# Patient Record
Sex: Female | Born: 1935 | Race: White | Hispanic: No | Smoking: Never smoker
Health system: Southern US, Community
[De-identification: ages and names within clinical notes are randomized; demographics above are authoritative.]

## PROBLEM LIST (undated history)

## (undated) DIAGNOSIS — E119 Type 2 diabetes mellitus without complications: Secondary | ICD-10-CM

## (undated) DIAGNOSIS — E785 Hyperlipidemia, unspecified: Secondary | ICD-10-CM

## (undated) DIAGNOSIS — A159 Respiratory tuberculosis unspecified: Secondary | ICD-10-CM

## (undated) DIAGNOSIS — M1990 Unspecified osteoarthritis, unspecified site: Secondary | ICD-10-CM

## (undated) DIAGNOSIS — H269 Unspecified cataract: Secondary | ICD-10-CM

## (undated) DIAGNOSIS — I10 Essential (primary) hypertension: Secondary | ICD-10-CM

## (undated) DIAGNOSIS — N393 Stress incontinence (female) (male): Secondary | ICD-10-CM

## (undated) DIAGNOSIS — E559 Vitamin D deficiency, unspecified: Secondary | ICD-10-CM

## (undated) DIAGNOSIS — G709 Myoneural disorder, unspecified: Secondary | ICD-10-CM

## (undated) DIAGNOSIS — E538 Deficiency of other specified B group vitamins: Secondary | ICD-10-CM

## (undated) HISTORY — PX: OTHER SURGICAL HISTORY: SHX169

## (undated) HISTORY — DX: Deficiency of other specified B group vitamins: E53.8

## (undated) HISTORY — PX: ABDOMINAL HYSTERECTOMY: SHX81

## (undated) HISTORY — DX: Vitamin D deficiency, unspecified: E55.9

## (undated) HISTORY — PX: CATARACT EXTRACTION: SUR2

## (undated) HISTORY — PX: NASAL RECONSTRUCTION: SHX2069

## (undated) HISTORY — DX: Unspecified cataract: H26.9

## (undated) HISTORY — DX: Type 2 diabetes mellitus without complications: E11.9

## (undated) HISTORY — PX: BREAST REDUCTION SURGERY: SHX8

## (undated) HISTORY — DX: Unspecified osteoarthritis, unspecified site: M19.90

## (undated) HISTORY — PX: BREAST BIOPSY: SHX20

## (undated) HISTORY — DX: Essential (primary) hypertension: I10

## (undated) HISTORY — PX: REDUCTION MAMMAPLASTY: SUR839

## (undated) HISTORY — PX: COLONOSCOPY: SHX174

## (undated) HISTORY — DX: Hyperlipidemia, unspecified: E78.5

---

## 2006-06-23 ENCOUNTER — Ambulatory Visit: Payer: Self-pay | Admitting: Internal Medicine

## 2006-07-18 ENCOUNTER — Ambulatory Visit: Payer: Self-pay | Admitting: Internal Medicine

## 2007-06-25 ENCOUNTER — Encounter: Admission: RE | Admit: 2007-06-25 | Discharge: 2007-06-25 | Payer: Self-pay | Admitting: Internal Medicine

## 2008-07-01 ENCOUNTER — Encounter: Admission: RE | Admit: 2008-07-01 | Discharge: 2008-07-01 | Payer: Self-pay | Admitting: Internal Medicine

## 2009-07-04 ENCOUNTER — Encounter: Admission: RE | Admit: 2009-07-04 | Discharge: 2009-07-04 | Payer: Self-pay | Admitting: Internal Medicine

## 2010-07-23 ENCOUNTER — Other Ambulatory Visit: Payer: Self-pay | Admitting: Internal Medicine

## 2010-07-23 DIAGNOSIS — Z1231 Encounter for screening mammogram for malignant neoplasm of breast: Secondary | ICD-10-CM

## 2010-07-31 ENCOUNTER — Ambulatory Visit
Admission: RE | Admit: 2010-07-31 | Discharge: 2010-07-31 | Disposition: A | Discharge status: Home / Self Care | Payer: Medicare Other | Source: Ambulatory Visit | Attending: Internal Medicine | Admitting: Internal Medicine

## 2010-07-31 DIAGNOSIS — Z1231 Encounter for screening mammogram for malignant neoplasm of breast: Secondary | ICD-10-CM

## 2010-08-01 ENCOUNTER — Ambulatory Visit: Payer: Medicare Other | Attending: Orthopaedic Surgery | Admitting: Physical Therapy

## 2010-08-01 DIAGNOSIS — IMO0001 Reserved for inherently not codable concepts without codable children: Secondary | ICD-10-CM | POA: Insufficient documentation

## 2010-08-01 DIAGNOSIS — R5381 Other malaise: Secondary | ICD-10-CM | POA: Insufficient documentation

## 2010-08-01 DIAGNOSIS — M2550 Pain in unspecified joint: Secondary | ICD-10-CM | POA: Insufficient documentation

## 2010-08-01 DIAGNOSIS — M2560 Stiffness of unspecified joint, not elsewhere classified: Secondary | ICD-10-CM | POA: Insufficient documentation

## 2010-08-03 ENCOUNTER — Ambulatory Visit: Payer: Medicare Other | Admitting: Physical Therapy

## 2010-08-06 ENCOUNTER — Ambulatory Visit: Payer: Medicare Other | Admitting: Physical Therapy

## 2010-08-08 ENCOUNTER — Ambulatory Visit: Payer: Medicare Other | Attending: Orthopaedic Surgery | Admitting: Physical Therapy

## 2010-08-08 DIAGNOSIS — M2560 Stiffness of unspecified joint, not elsewhere classified: Secondary | ICD-10-CM | POA: Insufficient documentation

## 2010-08-08 DIAGNOSIS — R5381 Other malaise: Secondary | ICD-10-CM | POA: Insufficient documentation

## 2010-08-08 DIAGNOSIS — M2550 Pain in unspecified joint: Secondary | ICD-10-CM | POA: Insufficient documentation

## 2010-08-08 DIAGNOSIS — IMO0001 Reserved for inherently not codable concepts without codable children: Secondary | ICD-10-CM | POA: Insufficient documentation

## 2010-08-13 ENCOUNTER — Ambulatory Visit: Payer: Medicare Other | Admitting: Physical Therapy

## 2010-08-24 ENCOUNTER — Ambulatory Visit: Payer: Medicare Other | Admitting: Physical Therapy

## 2010-08-28 ENCOUNTER — Encounter: Payer: Medicare Other | Admitting: Physical Therapy

## 2010-09-04 ENCOUNTER — Ambulatory Visit: Payer: Medicare Other | Admitting: Physical Therapy

## 2011-01-16 DIAGNOSIS — IMO0001 Reserved for inherently not codable concepts without codable children: Secondary | ICD-10-CM | POA: Diagnosis not present

## 2011-01-16 DIAGNOSIS — M1990 Unspecified osteoarthritis, unspecified site: Secondary | ICD-10-CM | POA: Diagnosis not present

## 2011-01-16 DIAGNOSIS — R700 Elevated erythrocyte sedimentation rate: Secondary | ICD-10-CM | POA: Diagnosis not present

## 2011-01-16 DIAGNOSIS — M2550 Pain in unspecified joint: Secondary | ICD-10-CM | POA: Diagnosis not present

## 2011-02-26 DIAGNOSIS — E785 Hyperlipidemia, unspecified: Secondary | ICD-10-CM | POA: Diagnosis not present

## 2011-02-26 DIAGNOSIS — I10 Essential (primary) hypertension: Secondary | ICD-10-CM | POA: Diagnosis not present

## 2011-02-26 DIAGNOSIS — E119 Type 2 diabetes mellitus without complications: Secondary | ICD-10-CM | POA: Diagnosis not present

## 2011-02-26 DIAGNOSIS — M353 Polymyalgia rheumatica: Secondary | ICD-10-CM | POA: Diagnosis not present

## 2011-03-25 DIAGNOSIS — H2510 Age-related nuclear cataract, unspecified eye: Secondary | ICD-10-CM | POA: Diagnosis not present

## 2011-03-25 DIAGNOSIS — H52209 Unspecified astigmatism, unspecified eye: Secondary | ICD-10-CM | POA: Diagnosis not present

## 2011-03-25 DIAGNOSIS — E119 Type 2 diabetes mellitus without complications: Secondary | ICD-10-CM | POA: Diagnosis not present

## 2011-03-25 DIAGNOSIS — H25049 Posterior subcapsular polar age-related cataract, unspecified eye: Secondary | ICD-10-CM | POA: Diagnosis not present

## 2011-04-10 DIAGNOSIS — IMO0002 Reserved for concepts with insufficient information to code with codable children: Secondary | ICD-10-CM | POA: Diagnosis not present

## 2011-04-10 DIAGNOSIS — H25049 Posterior subcapsular polar age-related cataract, unspecified eye: Secondary | ICD-10-CM | POA: Diagnosis not present

## 2011-04-10 DIAGNOSIS — H2589 Other age-related cataract: Secondary | ICD-10-CM | POA: Diagnosis not present

## 2011-04-10 DIAGNOSIS — E119 Type 2 diabetes mellitus without complications: Secondary | ICD-10-CM | POA: Diagnosis not present

## 2011-04-10 DIAGNOSIS — H25019 Cortical age-related cataract, unspecified eye: Secondary | ICD-10-CM | POA: Diagnosis not present

## 2011-04-10 DIAGNOSIS — H2510 Age-related nuclear cataract, unspecified eye: Secondary | ICD-10-CM | POA: Diagnosis not present

## 2011-04-17 DIAGNOSIS — M1990 Unspecified osteoarthritis, unspecified site: Secondary | ICD-10-CM | POA: Diagnosis not present

## 2011-04-17 DIAGNOSIS — IMO0001 Reserved for inherently not codable concepts without codable children: Secondary | ICD-10-CM | POA: Diagnosis not present

## 2011-04-17 DIAGNOSIS — M2550 Pain in unspecified joint: Secondary | ICD-10-CM | POA: Diagnosis not present

## 2011-05-28 DIAGNOSIS — E785 Hyperlipidemia, unspecified: Secondary | ICD-10-CM | POA: Diagnosis not present

## 2011-05-28 DIAGNOSIS — M353 Polymyalgia rheumatica: Secondary | ICD-10-CM | POA: Diagnosis not present

## 2011-05-28 DIAGNOSIS — I10 Essential (primary) hypertension: Secondary | ICD-10-CM | POA: Diagnosis not present

## 2011-05-28 DIAGNOSIS — E119 Type 2 diabetes mellitus without complications: Secondary | ICD-10-CM | POA: Diagnosis not present

## 2011-06-25 ENCOUNTER — Other Ambulatory Visit: Payer: Self-pay | Admitting: Internal Medicine

## 2011-06-25 DIAGNOSIS — Z1231 Encounter for screening mammogram for malignant neoplasm of breast: Secondary | ICD-10-CM

## 2011-09-03 ENCOUNTER — Ambulatory Visit
Admission: RE | Admit: 2011-09-03 | Discharge: 2011-09-03 | Disposition: A | Discharge status: Home / Self Care | Payer: Medicare Other | Source: Ambulatory Visit | Attending: Internal Medicine | Admitting: Internal Medicine

## 2011-09-03 DIAGNOSIS — E559 Vitamin D deficiency, unspecified: Secondary | ICD-10-CM | POA: Diagnosis not present

## 2011-09-03 DIAGNOSIS — I10 Essential (primary) hypertension: Secondary | ICD-10-CM | POA: Diagnosis not present

## 2011-09-03 DIAGNOSIS — Z1231 Encounter for screening mammogram for malignant neoplasm of breast: Secondary | ICD-10-CM | POA: Diagnosis not present

## 2011-09-03 DIAGNOSIS — E119 Type 2 diabetes mellitus without complications: Secondary | ICD-10-CM | POA: Diagnosis not present

## 2011-09-03 DIAGNOSIS — D531 Other megaloblastic anemias, not elsewhere classified: Secondary | ICD-10-CM | POA: Diagnosis not present

## 2011-09-03 DIAGNOSIS — E785 Hyperlipidemia, unspecified: Secondary | ICD-10-CM | POA: Diagnosis not present

## 2011-10-16 DIAGNOSIS — R5383 Other fatigue: Secondary | ICD-10-CM | POA: Diagnosis not present

## 2011-10-16 DIAGNOSIS — M1990 Unspecified osteoarthritis, unspecified site: Secondary | ICD-10-CM | POA: Diagnosis not present

## 2011-10-16 DIAGNOSIS — M2550 Pain in unspecified joint: Secondary | ICD-10-CM | POA: Diagnosis not present

## 2011-10-16 DIAGNOSIS — IMO0001 Reserved for inherently not codable concepts without codable children: Secondary | ICD-10-CM | POA: Diagnosis not present

## 2011-10-22 DIAGNOSIS — Z23 Encounter for immunization: Secondary | ICD-10-CM | POA: Diagnosis not present

## 2011-11-12 DIAGNOSIS — E119 Type 2 diabetes mellitus without complications: Secondary | ICD-10-CM | POA: Diagnosis not present

## 2011-11-12 DIAGNOSIS — H40019 Open angle with borderline findings, low risk, unspecified eye: Secondary | ICD-10-CM | POA: Diagnosis not present

## 2011-11-12 DIAGNOSIS — H5200 Hypermetropia, unspecified eye: Secondary | ICD-10-CM | POA: Diagnosis not present

## 2011-11-12 DIAGNOSIS — H43819 Vitreous degeneration, unspecified eye: Secondary | ICD-10-CM | POA: Diagnosis not present

## 2011-11-26 DIAGNOSIS — E119 Type 2 diabetes mellitus without complications: Secondary | ICD-10-CM | POA: Diagnosis not present

## 2011-11-26 DIAGNOSIS — I10 Essential (primary) hypertension: Secondary | ICD-10-CM | POA: Diagnosis not present

## 2011-11-26 DIAGNOSIS — E538 Deficiency of other specified B group vitamins: Secondary | ICD-10-CM | POA: Diagnosis not present

## 2011-11-26 DIAGNOSIS — E785 Hyperlipidemia, unspecified: Secondary | ICD-10-CM | POA: Diagnosis not present

## 2011-11-26 DIAGNOSIS — E559 Vitamin D deficiency, unspecified: Secondary | ICD-10-CM | POA: Diagnosis not present

## 2011-12-03 DIAGNOSIS — Z0000 Encounter for general adult medical examination without abnormal findings: Secondary | ICD-10-CM | POA: Diagnosis not present

## 2011-12-03 DIAGNOSIS — E785 Hyperlipidemia, unspecified: Secondary | ICD-10-CM | POA: Diagnosis not present

## 2011-12-03 DIAGNOSIS — E1139 Type 2 diabetes mellitus with other diabetic ophthalmic complication: Secondary | ICD-10-CM | POA: Diagnosis not present

## 2011-12-03 DIAGNOSIS — I10 Essential (primary) hypertension: Secondary | ICD-10-CM | POA: Diagnosis not present

## 2011-12-09 DIAGNOSIS — Z1212 Encounter for screening for malignant neoplasm of rectum: Secondary | ICD-10-CM | POA: Diagnosis not present

## 2011-12-18 DIAGNOSIS — M19019 Primary osteoarthritis, unspecified shoulder: Secondary | ICD-10-CM | POA: Diagnosis not present

## 2011-12-18 DIAGNOSIS — M25519 Pain in unspecified shoulder: Secondary | ICD-10-CM | POA: Diagnosis not present

## 2012-03-02 DIAGNOSIS — M19019 Primary osteoarthritis, unspecified shoulder: Secondary | ICD-10-CM | POA: Diagnosis not present

## 2012-03-18 ENCOUNTER — Ambulatory Visit: Payer: Medicare Other | Attending: Orthopaedic Surgery | Admitting: Physical Therapy

## 2012-03-18 DIAGNOSIS — M25619 Stiffness of unspecified shoulder, not elsewhere classified: Secondary | ICD-10-CM | POA: Insufficient documentation

## 2012-03-18 DIAGNOSIS — M25519 Pain in unspecified shoulder: Secondary | ICD-10-CM | POA: Diagnosis not present

## 2012-03-18 DIAGNOSIS — IMO0001 Reserved for inherently not codable concepts without codable children: Secondary | ICD-10-CM | POA: Insufficient documentation

## 2012-03-19 ENCOUNTER — Ambulatory Visit: Payer: Medicare Other | Admitting: Physical Therapy

## 2012-03-19 DIAGNOSIS — M25519 Pain in unspecified shoulder: Secondary | ICD-10-CM | POA: Diagnosis not present

## 2012-03-19 DIAGNOSIS — IMO0001 Reserved for inherently not codable concepts without codable children: Secondary | ICD-10-CM | POA: Diagnosis not present

## 2012-03-19 DIAGNOSIS — M25619 Stiffness of unspecified shoulder, not elsewhere classified: Secondary | ICD-10-CM | POA: Diagnosis not present

## 2012-03-24 ENCOUNTER — Ambulatory Visit: Payer: Medicare Other | Admitting: Physical Therapy

## 2012-03-24 DIAGNOSIS — M25619 Stiffness of unspecified shoulder, not elsewhere classified: Secondary | ICD-10-CM | POA: Diagnosis not present

## 2012-03-24 DIAGNOSIS — M25519 Pain in unspecified shoulder: Secondary | ICD-10-CM | POA: Diagnosis not present

## 2012-03-24 DIAGNOSIS — IMO0001 Reserved for inherently not codable concepts without codable children: Secondary | ICD-10-CM | POA: Diagnosis not present

## 2012-03-25 ENCOUNTER — Ambulatory Visit: Payer: Medicare Other | Admitting: Physical Therapy

## 2012-03-25 DIAGNOSIS — IMO0001 Reserved for inherently not codable concepts without codable children: Secondary | ICD-10-CM | POA: Diagnosis not present

## 2012-03-25 DIAGNOSIS — M25519 Pain in unspecified shoulder: Secondary | ICD-10-CM | POA: Diagnosis not present

## 2012-03-25 DIAGNOSIS — M25619 Stiffness of unspecified shoulder, not elsewhere classified: Secondary | ICD-10-CM | POA: Diagnosis not present

## 2012-03-26 DIAGNOSIS — M2550 Pain in unspecified joint: Secondary | ICD-10-CM | POA: Diagnosis not present

## 2012-03-26 DIAGNOSIS — R5381 Other malaise: Secondary | ICD-10-CM | POA: Diagnosis not present

## 2012-03-26 DIAGNOSIS — IMO0001 Reserved for inherently not codable concepts without codable children: Secondary | ICD-10-CM | POA: Diagnosis not present

## 2012-03-26 DIAGNOSIS — R5383 Other fatigue: Secondary | ICD-10-CM | POA: Diagnosis not present

## 2012-03-26 DIAGNOSIS — M1990 Unspecified osteoarthritis, unspecified site: Secondary | ICD-10-CM | POA: Diagnosis not present

## 2012-03-31 ENCOUNTER — Ambulatory Visit: Payer: Medicare Other | Admitting: Physical Therapy

## 2012-03-31 DIAGNOSIS — IMO0001 Reserved for inherently not codable concepts without codable children: Secondary | ICD-10-CM | POA: Diagnosis not present

## 2012-03-31 DIAGNOSIS — M25619 Stiffness of unspecified shoulder, not elsewhere classified: Secondary | ICD-10-CM | POA: Diagnosis not present

## 2012-03-31 DIAGNOSIS — M25519 Pain in unspecified shoulder: Secondary | ICD-10-CM | POA: Diagnosis not present

## 2012-04-02 ENCOUNTER — Ambulatory Visit: Payer: Medicare Other | Admitting: Physical Therapy

## 2012-04-02 DIAGNOSIS — M25619 Stiffness of unspecified shoulder, not elsewhere classified: Secondary | ICD-10-CM | POA: Diagnosis not present

## 2012-04-02 DIAGNOSIS — M25519 Pain in unspecified shoulder: Secondary | ICD-10-CM | POA: Diagnosis not present

## 2012-04-02 DIAGNOSIS — IMO0001 Reserved for inherently not codable concepts without codable children: Secondary | ICD-10-CM | POA: Diagnosis not present

## 2012-04-07 ENCOUNTER — Encounter: Payer: Medicare Other | Admitting: Physical Therapy

## 2012-04-09 ENCOUNTER — Encounter: Payer: Medicare Other | Admitting: Physical Therapy

## 2012-04-14 ENCOUNTER — Ambulatory Visit: Payer: Medicare Other | Attending: Orthopaedic Surgery | Admitting: Physical Therapy

## 2012-04-14 DIAGNOSIS — M25619 Stiffness of unspecified shoulder, not elsewhere classified: Secondary | ICD-10-CM | POA: Insufficient documentation

## 2012-04-14 DIAGNOSIS — IMO0001 Reserved for inherently not codable concepts without codable children: Secondary | ICD-10-CM | POA: Insufficient documentation

## 2012-04-14 DIAGNOSIS — M25519 Pain in unspecified shoulder: Secondary | ICD-10-CM | POA: Insufficient documentation

## 2012-04-16 ENCOUNTER — Ambulatory Visit: Payer: Medicare Other | Admitting: Physical Therapy

## 2012-04-16 DIAGNOSIS — IMO0001 Reserved for inherently not codable concepts without codable children: Secondary | ICD-10-CM | POA: Diagnosis not present

## 2012-04-16 DIAGNOSIS — M25619 Stiffness of unspecified shoulder, not elsewhere classified: Secondary | ICD-10-CM | POA: Diagnosis not present

## 2012-04-16 DIAGNOSIS — M25519 Pain in unspecified shoulder: Secondary | ICD-10-CM | POA: Diagnosis not present

## 2012-04-20 ENCOUNTER — Ambulatory Visit: Payer: Medicare Other | Admitting: Physical Therapy

## 2012-04-20 DIAGNOSIS — M25619 Stiffness of unspecified shoulder, not elsewhere classified: Secondary | ICD-10-CM | POA: Diagnosis not present

## 2012-04-20 DIAGNOSIS — M25519 Pain in unspecified shoulder: Secondary | ICD-10-CM | POA: Diagnosis not present

## 2012-04-20 DIAGNOSIS — IMO0001 Reserved for inherently not codable concepts without codable children: Secondary | ICD-10-CM | POA: Diagnosis not present

## 2012-04-22 ENCOUNTER — Ambulatory Visit: Payer: Medicare Other

## 2012-04-22 DIAGNOSIS — IMO0001 Reserved for inherently not codable concepts without codable children: Secondary | ICD-10-CM | POA: Diagnosis not present

## 2012-04-22 DIAGNOSIS — M25519 Pain in unspecified shoulder: Secondary | ICD-10-CM | POA: Diagnosis not present

## 2012-04-22 DIAGNOSIS — M25619 Stiffness of unspecified shoulder, not elsewhere classified: Secondary | ICD-10-CM | POA: Diagnosis not present

## 2012-06-02 DIAGNOSIS — E785 Hyperlipidemia, unspecified: Secondary | ICD-10-CM | POA: Diagnosis not present

## 2012-06-02 DIAGNOSIS — E1139 Type 2 diabetes mellitus with other diabetic ophthalmic complication: Secondary | ICD-10-CM | POA: Diagnosis not present

## 2012-06-02 DIAGNOSIS — I10 Essential (primary) hypertension: Secondary | ICD-10-CM | POA: Diagnosis not present

## 2012-06-02 DIAGNOSIS — Z6829 Body mass index (BMI) 29.0-29.9, adult: Secondary | ICD-10-CM | POA: Diagnosis not present

## 2012-06-02 DIAGNOSIS — M25519 Pain in unspecified shoulder: Secondary | ICD-10-CM | POA: Diagnosis not present

## 2012-06-02 DIAGNOSIS — D518 Other vitamin B12 deficiency anemias: Secondary | ICD-10-CM | POA: Diagnosis not present

## 2012-06-02 DIAGNOSIS — E559 Vitamin D deficiency, unspecified: Secondary | ICD-10-CM | POA: Diagnosis not present

## 2012-06-02 DIAGNOSIS — M1990 Unspecified osteoarthritis, unspecified site: Secondary | ICD-10-CM | POA: Diagnosis not present

## 2012-08-19 ENCOUNTER — Other Ambulatory Visit: Payer: Self-pay

## 2012-08-19 DIAGNOSIS — Z1231 Encounter for screening mammogram for malignant neoplasm of breast: Secondary | ICD-10-CM

## 2012-09-02 ENCOUNTER — Ambulatory Visit
Admission: RE | Admit: 2012-09-02 | Discharge: 2012-09-02 | Disposition: A | Discharge status: Home / Self Care | Payer: Medicare Other | Source: Ambulatory Visit

## 2012-09-02 DIAGNOSIS — Z1231 Encounter for screening mammogram for malignant neoplasm of breast: Secondary | ICD-10-CM

## 2012-09-04 DIAGNOSIS — H40019 Open angle with borderline findings, low risk, unspecified eye: Secondary | ICD-10-CM | POA: Diagnosis not present

## 2012-09-04 DIAGNOSIS — H2510 Age-related nuclear cataract, unspecified eye: Secondary | ICD-10-CM | POA: Diagnosis not present

## 2012-09-04 DIAGNOSIS — H25019 Cortical age-related cataract, unspecified eye: Secondary | ICD-10-CM | POA: Diagnosis not present

## 2012-09-04 DIAGNOSIS — E119 Type 2 diabetes mellitus without complications: Secondary | ICD-10-CM | POA: Diagnosis not present

## 2012-12-02 DIAGNOSIS — E1139 Type 2 diabetes mellitus with other diabetic ophthalmic complication: Secondary | ICD-10-CM | POA: Diagnosis not present

## 2012-12-02 DIAGNOSIS — E559 Vitamin D deficiency, unspecified: Secondary | ICD-10-CM | POA: Diagnosis not present

## 2012-12-02 DIAGNOSIS — E538 Deficiency of other specified B group vitamins: Secondary | ICD-10-CM | POA: Diagnosis not present

## 2012-12-02 DIAGNOSIS — E785 Hyperlipidemia, unspecified: Secondary | ICD-10-CM | POA: Diagnosis not present

## 2012-12-08 DIAGNOSIS — M353 Polymyalgia rheumatica: Secondary | ICD-10-CM | POA: Diagnosis not present

## 2012-12-08 DIAGNOSIS — E559 Vitamin D deficiency, unspecified: Secondary | ICD-10-CM | POA: Diagnosis not present

## 2012-12-08 DIAGNOSIS — Z0000 Encounter for general adult medical examination without abnormal findings: Secondary | ICD-10-CM | POA: Diagnosis not present

## 2012-12-08 DIAGNOSIS — Z1331 Encounter for screening for depression: Secondary | ICD-10-CM | POA: Diagnosis not present

## 2012-12-08 DIAGNOSIS — Z23 Encounter for immunization: Secondary | ICD-10-CM | POA: Diagnosis not present

## 2012-12-08 DIAGNOSIS — D518 Other vitamin B12 deficiency anemias: Secondary | ICD-10-CM | POA: Diagnosis not present

## 2012-12-08 DIAGNOSIS — E785 Hyperlipidemia, unspecified: Secondary | ICD-10-CM | POA: Diagnosis not present

## 2012-12-08 DIAGNOSIS — I10 Essential (primary) hypertension: Secondary | ICD-10-CM | POA: Diagnosis not present

## 2012-12-08 DIAGNOSIS — M1990 Unspecified osteoarthritis, unspecified site: Secondary | ICD-10-CM | POA: Diagnosis not present

## 2012-12-08 DIAGNOSIS — E1139 Type 2 diabetes mellitus with other diabetic ophthalmic complication: Secondary | ICD-10-CM | POA: Diagnosis not present

## 2012-12-09 DIAGNOSIS — Z1212 Encounter for screening for malignant neoplasm of rectum: Secondary | ICD-10-CM | POA: Diagnosis not present

## 2012-12-14 ENCOUNTER — Encounter: Payer: Self-pay | Admitting: Internal Medicine

## 2013-01-15 ENCOUNTER — Encounter: Payer: Self-pay | Admitting: Internal Medicine

## 2013-01-15 ENCOUNTER — Ambulatory Visit (INDEPENDENT_AMBULATORY_CARE_PROVIDER_SITE_OTHER): Payer: Medicare Other | Admitting: Internal Medicine

## 2013-01-15 VITALS — BP 134/80 | HR 80 | Ht 65.0 in | Wt 171.0 lb

## 2013-01-15 DIAGNOSIS — R195 Other fecal abnormalities: Secondary | ICD-10-CM | POA: Diagnosis not present

## 2013-01-15 DIAGNOSIS — E1059 Type 1 diabetes mellitus with other circulatory complications: Secondary | ICD-10-CM | POA: Diagnosis not present

## 2013-01-15 DIAGNOSIS — R198 Other specified symptoms and signs involving the digestive system and abdomen: Secondary | ICD-10-CM | POA: Diagnosis not present

## 2013-01-15 MED ORDER — MOVIPREP 100 G PO SOLR: 1.0000 | Freq: Once | ORAL | Status: DC

## 2013-01-15 NOTE — Patient Instructions (Signed)

## 2013-01-15 NOTE — Progress Notes (Signed)
HISTORY OF PRESENT ILLNESS:  Laura Lowery is a 80 y.o. female with hyperlipidemia, arthritis, hypertension, and insulin requiring diabetes mellitus. She is sent today by her primary provider regarding Hemoccult-positive stool. The patient went for her annual evaluation (office evaluation note reviewed) and was complaining of fatigue. Blood work (reviewed) was unremarkable including CBC. Hemoccult studies returned positive. She had been using Mobic for arthritis. This has been discontinued. The patient's GI review of systems is negative except for more frequent and loose bowel movements. She denies heartburn, abdominal pain, dysphagia, melena, hematochezia, or weight loss. She last underwent colonoscopy 07/18/2006. The examination was complete to the cecum with excellent preparation. She was found to have severe pandiverticulosis. No polyps. No routine followup due to age. Her chronic medical problems are under good control. In addition to Lantus insulin she takes metformin for diabetes. She's a former Equities trader. She is quite active.  REVIEW OF SYSTEMS:  All non-GI ROS negative except for arthritis  Past Medical History  Diagnosis Date  . Hyperlipidemia   . DJD (degenerative joint disease)     c spine  . Osteoarthritis   . Cataracts, bilateral   . Hypertension   . Vitamin D deficiency   . Anemia   . Vitamin B 12 deficiency   . Diabetes     Past Surgical History  Procedure Laterality Date  . Bone spur    . Abdominal hysterectomy    . Nasal reconstruction    . Breast reduction surgery    . Breast biopsy    . Cataract extraction      Social History Laura Lowery  reports that she has never smoked. She has never used smokeless tobacco. She reports that she does not drink alcohol or use illicit drugs.  family history includes COPD in her father; Heart attack in her mother; Liver cancer in her sister; Pancreatic cancer in her sister. There is no history of Colon cancer.  Not on  File     PHYSICAL EXAMINATION: Vital signs: BP 134/80  Pulse 80  Ht 5\' 5"  (1.651 m)  Wt 171 lb (77.565 kg)  BMI 28.46 kg/m2  Constitutional: generally well-appearing, no acute distress Psychiatric: alert and oriented x3, cooperative Eyes: extraocular movements intact, anicteric, conjunctiva pink Mouth: oral pharynx moist, no lesions Neck: supple no lymphadenopathy Cardiovascular: heart regular rate and rhythm, no murmur Lungs: clear to auscultation bilaterally Abdomen: soft, nontender, nondistended, no obvious ascites, no peritoneal signs, normal bowel sounds, no organomegaly Rectal: Deferred until colonoscopy Extremities: no lower extremity edema bilaterally Skin: no lesions on visible extremities Neuro: No focal deficits.   ASSESSMENT:  #1. Hemoccult-positive stool. Etiology and clinical significance uncertain. No anemia. Negative GI review of systems except for increased frequency of bowel movements #2. Arthritis with chronic NSAID use. Recently discontinued due to Hemoccult-positive stool #3. General medical problems including diabetes. Stable   PLAN:  #1. We discussed in detail the pros and cons of evaluating Hemoccult-positive stool. We discussed a variety of alternative strategies. To this end, we will proceed with colonoscopy and upper endoscopy to exclude significant mucosal pathology. Patient is higher than baseline risk given her age, comorbidities.The nature of the procedure, as well as the risks, benefits, and alternatives were carefully and thoroughly reviewed with the patient. Ample time for discussion and questions allowed. The patient understood, was satisfied, and agreed to proceed. Movi prep prescribed. The patient instructed on his use #2. Instructed to give one half p.m. Lantus insulin the day before the procedure and hold  morning metformin. This to avoid and wanted hypoglycemia

## 2013-02-25 ENCOUNTER — Encounter: Payer: Self-pay | Admitting: Internal Medicine

## 2013-02-25 ENCOUNTER — Ambulatory Visit (AMBULATORY_SURGERY_CENTER): Payer: Medicare Other | Admitting: Internal Medicine

## 2013-02-25 VITALS — BP 160/77 | HR 57 | Temp 97.6°F | Resp 16 | Ht 65.0 in | Wt 171.0 lb

## 2013-02-25 DIAGNOSIS — R195 Other fecal abnormalities: Secondary | ICD-10-CM | POA: Diagnosis not present

## 2013-02-25 DIAGNOSIS — I10 Essential (primary) hypertension: Secondary | ICD-10-CM | POA: Diagnosis not present

## 2013-02-25 DIAGNOSIS — K222 Esophageal obstruction: Secondary | ICD-10-CM

## 2013-02-25 DIAGNOSIS — K625 Hemorrhage of anus and rectum: Secondary | ICD-10-CM | POA: Diagnosis not present

## 2013-02-25 DIAGNOSIS — E7800 Pure hypercholesterolemia, unspecified: Secondary | ICD-10-CM | POA: Diagnosis not present

## 2013-02-25 DIAGNOSIS — K2100 Gastro-esophageal reflux disease with esophagitis, without bleeding: Secondary | ICD-10-CM

## 2013-02-25 MED ORDER — SODIUM CHLORIDE 0.9 % IV SOLN: 500.0000 mL | INTRAVENOUS | Status: DC

## 2013-02-25 NOTE — Progress Notes (Signed)
Report to pacu rn, vss, bbs=clear 

## 2013-02-25 NOTE — Progress Notes (Addendum)
NO EGG OR SOY ALLERGY. EWM NO PROBLEMS WITH PAST SEDATION. EWM 1,2 IV ATTEMPTS TO RT ARM X1, RT ARM X2 UNSUCCESSFUL BY S HODGES RN. PT TOLERATED WELL/. EWM

## 2013-02-25 NOTE — Op Note (Signed)
[facility]  [address] [place], [address]   ENDOSCOPY PROCEDURE REPORT  PATIENT: Laura, Lowery  MR#: [id number] BIRTHDATE: Jul 24, 1928 , 84  yrs. old GENDER: Female ENDOSCOPIST: Eustace Quail, MD REFERRED BY:  Domenick Gong, M.D. PROCEDURE DATE:  02/25/2013 PROCEDURE:  EGD, diagnostic ASA CLASS:     Class II INDICATIONS:  Heme positive stool. MEDICATIONS: MAC sedation, administered by CRNA and propofol (Diprivan) 60mg  IV TOPICAL ANESTHETIC: none  DESCRIPTION OF PROCEDURE: After the risks benefits and alternatives of the procedure were thoroughly explained, informed consent was obtained.  The LB [id number] endoscope was introduced through the mouth and advanced to the second portion of the duodenum. Without limitations.  The instrument was slowly withdrawn as the mucosa was fully examined.      EXAM:The esophagus revealed distal esophagitis as manifested by friability and edema of the Z line.  As well, large-caliber peptic stricture.  The stomach was normal.  The duodenum was normal. Retroflexed views revealed a hiatal hernia.     The scope was then withdrawn from the patient and the procedure completed.  COMPLICATIONS: There were no complications. ENDOSCOPIC IMPRESSION: 1. GERD with endoscopic evidence of esophagitis and stricture (currently asymptomatic).  RECOMMENDATIONS: 1.  Anti-reflux regimen to be followed 2.  Recommend Prilosec OTC 20 mg daily  REPEAT EXAM:  eSigned:  Eustace Quail, MD 02/25/2013 12:45 PM   CW:[id number] Tisovec, MD and The Patient

## 2013-02-25 NOTE — Op Note (Signed)
[facility]  [address] [place], [id number]   COLONOSCOPY PROCEDURE REPORT  PATIENT: Laura Lowery, Laura Lowery  MR#: [id number] BIRTHDATE: 09-22-1928 , 84  yrs. old GENDER: Female ENDOSCOPIST: Eustace Quail, MD REFERRED MB:[id number] Tisovec, M.D. PROCEDURE DATE:  02/25/2013 PROCEDURE:   Colonoscopy, diagnostic First Screening Colonoscopy - Avg.  risk and is 50 yrs.  old or older - No.  Prior Negative Screening - Now for repeat screening. N/A  History of Adenoma - Now for follow-up colonoscopy & has been > or = to 3 yrs.  N/A  Polyps Removed Today? No.  Recommend repeat exam, <10 yrs? No. ASA CLASS:   Class II INDICATIONS:heme-positive stool. MEDICATIONS: MAC sedation, administered by CRNA and propofol (Diprivan) 200mg  IV  DESCRIPTION OF PROCEDURE:   After the risks benefits and alternatives of the procedure were thoroughly explained, informed consent was obtained.  A digital rectal exam revealed no abnormalities of the rectum.   The LB [id number]  endoscope was introduced through the anus and advanced to the cecum, which was identified by both the appendix and ileocecal valve. No adverse events experienced.   The quality of the prep was excellent, using MoviPrep  The instrument was then slowly withdrawn as the colon was fully examined.   COLON FINDINGS: Moderate diverticulosis was noted The finding was in the right colon and The finding was left colon.   The colon mucosa was otherwise normal.  Retroflexed views revealed internal hemorrhoids. The time to cecum=2 minutes 06 seconds.  Withdrawal time=8 minutes 01 seconds.  The scope was withdrawn and the procedure completed. COMPLICATIONS: There were no complications.  ENDOSCOPIC IMPRESSION: 1.   Moderate diverticulosis was noted in the right colon and left colon 2.   The colon mucosa was otherwise normal  RECOMMENDATIONS: 1. Upper endoscopy today (see report)   eSigned:  Eustace Quail, MD 02/25/2013  12:40 PM   cc: Domenick Gong, MD and The Patient

## 2013-02-25 NOTE — Patient Instructions (Signed)
YOU HAD AN ENDOSCOPIC PROCEDURE TODAY AT THE [facility]: Refer to the procedure report that was given to you for any specific questions about what was found during the examination.  If the procedure report does not answer your questions, please call your gastroenterologist to clarify.  If you requested that your care partner not be given the details of your procedure findings, then the procedure report has been included in a sealed envelope for you to review at your convenience later.  YOU SHOULD EXPECT: Some feelings of bloating in the abdomen. Passage of more gas than usual.  Walking can help get rid of the air that was put into your GI tract during the procedure and reduce the bloating. If you had a lower endoscopy (such as a colonoscopy or flexible sigmoidoscopy) you may notice spotting of blood in your stool or on the toilet paper. If you underwent a bowel prep for your procedure, then you may not have a normal bowel movement for a few days.  DIET: Your first meal following the procedure should be a light meal and then it is ok to progress to your normal diet.  A half-sandwich or bowl of soup is an example of a good first meal.  Heavy or fried foods are harder to digest and may make you feel nauseous or bloated.  Likewise meals heavy in dairy and vegetables can cause extra gas to form and this can also increase the bloating.  Drink plenty of fluids but you should avoid alcoholic beverages for 24 hours.  ACTIVITY: Your care partner should take you home directly after the procedure.  You should plan to take it easy, moving slowly for the rest of the day.  You can resume normal activity the day after the procedure however you should NOT DRIVE or use heavy machinery for 24 hours (because of the sedation medicines used during the test).    SYMPTOMS TO REPORT IMMEDIATELY: A gastroenterologist can be reached at any hour.  During normal business hours, 8:30 AM to 5:00 PM Monday through Friday,  call [phone].  After hours and on weekends, please call the GI answering service at [phone] who will take a message and have the physician on call contact you.   Following lower endoscopy (colonoscopy or flexible sigmoidoscopy):  Excessive amounts of blood in the stool  Significant tenderness or worsening of abdominal pains  Swelling of the abdomen that is new, acute  Fever of 100F or higher  Following upper endoscopy (EGD)  Vomiting of blood or coffee ground material  New chest pain or pain under the shoulder blades  Painful or persistently difficult swallowing  New shortness of breath  Fever of 100F or higher  Black, tarry-looking stools  FOLLOW UP: If any biopsies were taken you will be contacted by phone or by letter within the next 1-3 weeks.  Call your gastroenterologist if you have not heard about the biopsies in 3 weeks.  Our staff will call the home number listed on your records the next business day following your procedure to check on you and address any questions or concerns that you may have at that time regarding the information given to you following your procedure. This is a courtesy call and so if there is no answer at the home number and we have not heard from you through the emergency physician on call, we will assume that you have returned to your regular daily activities without incident.  SIGNATURES/CONFIDENTIALITY: You and/or your care   partner have signed paperwork which will be entered into your electronic medical record.  These signatures attest to the fact that that the information above on your After Visit Summary has been reviewed and is understood.  Full responsibility of the confidentiality of this discharge information lies with you and/or your care-partner.  GERD, esophagitis, stricture,anti-reflux, Diverticulosis, high fiber diet-handouts given  Prilosec Over the counter 20 mg daily.

## 2013-02-26 ENCOUNTER — Telehealth: Payer: Self-pay | Admitting: *Deleted

## 2013-02-26 NOTE — Telephone Encounter (Signed)
  Follow up Call-  Call back number 02/25/2013  Post procedure Call Back phone  # [phone]  Permission to leave phone message Yes     Patient questions:  Do you have a fever, pain , or abdominal swelling? no Pain Score  0 *  Have you tolerated food without any problems? yes  Have you been able to return to your normal activities? yes  Do you have any questions about your discharge instructions: Diet   no Medications  no Follow up visit  no  Do you have questions or concerns about your Care? no  Actions: * If pain score is 4 or above: No action needed, pain <4.

## 2013-05-21 DIAGNOSIS — H25049 Posterior subcapsular polar age-related cataract, unspecified eye: Secondary | ICD-10-CM | POA: Diagnosis not present

## 2013-05-21 DIAGNOSIS — E119 Type 2 diabetes mellitus without complications: Secondary | ICD-10-CM | POA: Diagnosis not present

## 2013-05-21 DIAGNOSIS — H25019 Cortical age-related cataract, unspecified eye: Secondary | ICD-10-CM | POA: Diagnosis not present

## 2013-05-21 DIAGNOSIS — H2510 Age-related nuclear cataract, unspecified eye: Secondary | ICD-10-CM | POA: Diagnosis not present

## 2013-06-10 DIAGNOSIS — H259 Unspecified age-related cataract: Secondary | ICD-10-CM | POA: Diagnosis not present

## 2013-06-10 DIAGNOSIS — E785 Hyperlipidemia, unspecified: Secondary | ICD-10-CM | POA: Diagnosis not present

## 2013-06-10 DIAGNOSIS — E1139 Type 2 diabetes mellitus with other diabetic ophthalmic complication: Secondary | ICD-10-CM | POA: Diagnosis not present

## 2013-06-10 DIAGNOSIS — M353 Polymyalgia rheumatica: Secondary | ICD-10-CM | POA: Diagnosis not present

## 2013-06-10 DIAGNOSIS — M1990 Unspecified osteoarthritis, unspecified site: Secondary | ICD-10-CM | POA: Diagnosis not present

## 2013-06-10 DIAGNOSIS — I10 Essential (primary) hypertension: Secondary | ICD-10-CM | POA: Diagnosis not present

## 2013-06-10 DIAGNOSIS — E559 Vitamin D deficiency, unspecified: Secondary | ICD-10-CM | POA: Diagnosis not present

## 2013-06-10 DIAGNOSIS — Z6829 Body mass index (BMI) 29.0-29.9, adult: Secondary | ICD-10-CM | POA: Diagnosis not present

## 2013-06-16 DIAGNOSIS — E119 Type 2 diabetes mellitus without complications: Secondary | ICD-10-CM | POA: Diagnosis not present

## 2013-06-16 DIAGNOSIS — H2589 Other age-related cataract: Secondary | ICD-10-CM | POA: Diagnosis not present

## 2013-06-16 DIAGNOSIS — H25049 Posterior subcapsular polar age-related cataract, unspecified eye: Secondary | ICD-10-CM | POA: Diagnosis not present

## 2013-06-16 DIAGNOSIS — H25019 Cortical age-related cataract, unspecified eye: Secondary | ICD-10-CM | POA: Diagnosis not present

## 2013-06-16 DIAGNOSIS — H2510 Age-related nuclear cataract, unspecified eye: Secondary | ICD-10-CM | POA: Diagnosis not present

## 2013-07-29 ENCOUNTER — Other Ambulatory Visit: Payer: Self-pay

## 2013-07-29 DIAGNOSIS — Z1231 Encounter for screening mammogram for malignant neoplasm of breast: Secondary | ICD-10-CM

## 2013-09-03 ENCOUNTER — Encounter (INDEPENDENT_AMBULATORY_CARE_PROVIDER_SITE_OTHER): Payer: Self-pay

## 2013-09-03 ENCOUNTER — Ambulatory Visit
Admission: RE | Admit: 2013-09-03 | Discharge: 2013-09-03 | Disposition: A | Discharge status: Home / Self Care | Payer: Medicare Other | Source: Ambulatory Visit

## 2013-09-03 DIAGNOSIS — Z1231 Encounter for screening mammogram for malignant neoplasm of breast: Secondary | ICD-10-CM | POA: Diagnosis not present

## 2013-11-12 DIAGNOSIS — M1711 Unilateral primary osteoarthritis, right knee: Secondary | ICD-10-CM | POA: Diagnosis not present

## 2013-12-07 DIAGNOSIS — E785 Hyperlipidemia, unspecified: Secondary | ICD-10-CM | POA: Diagnosis not present

## 2013-12-07 DIAGNOSIS — I10 Essential (primary) hypertension: Secondary | ICD-10-CM | POA: Diagnosis not present

## 2013-12-07 DIAGNOSIS — E538 Deficiency of other specified B group vitamins: Secondary | ICD-10-CM | POA: Diagnosis not present

## 2013-12-07 DIAGNOSIS — Z780 Asymptomatic menopausal state: Secondary | ICD-10-CM | POA: Diagnosis not present

## 2013-12-07 DIAGNOSIS — N390 Urinary tract infection, site not specified: Secondary | ICD-10-CM | POA: Diagnosis not present

## 2013-12-07 DIAGNOSIS — Z0000 Encounter for general adult medical examination without abnormal findings: Secondary | ICD-10-CM | POA: Diagnosis not present

## 2013-12-07 DIAGNOSIS — E559 Vitamin D deficiency, unspecified: Secondary | ICD-10-CM | POA: Diagnosis not present

## 2013-12-07 DIAGNOSIS — E1139 Type 2 diabetes mellitus with other diabetic ophthalmic complication: Secondary | ICD-10-CM | POA: Diagnosis not present

## 2013-12-09 DIAGNOSIS — H259 Unspecified age-related cataract: Secondary | ICD-10-CM | POA: Diagnosis not present

## 2013-12-09 DIAGNOSIS — I10 Essential (primary) hypertension: Secondary | ICD-10-CM | POA: Diagnosis not present

## 2013-12-09 DIAGNOSIS — E785 Hyperlipidemia, unspecified: Secondary | ICD-10-CM | POA: Diagnosis not present

## 2013-12-09 DIAGNOSIS — E559 Vitamin D deficiency, unspecified: Secondary | ICD-10-CM | POA: Diagnosis not present

## 2013-12-09 DIAGNOSIS — M1711 Unilateral primary osteoarthritis, right knee: Secondary | ICD-10-CM | POA: Diagnosis not present

## 2013-12-09 DIAGNOSIS — Z0000 Encounter for general adult medical examination without abnormal findings: Secondary | ICD-10-CM | POA: Diagnosis not present

## 2013-12-09 DIAGNOSIS — M1990 Unspecified osteoarthritis, unspecified site: Secondary | ICD-10-CM | POA: Diagnosis not present

## 2013-12-09 DIAGNOSIS — E11319 Type 2 diabetes mellitus with unspecified diabetic retinopathy without macular edema: Secondary | ICD-10-CM | POA: Diagnosis not present

## 2013-12-09 DIAGNOSIS — Z23 Encounter for immunization: Secondary | ICD-10-CM | POA: Diagnosis not present

## 2013-12-09 DIAGNOSIS — M353 Polymyalgia rheumatica: Secondary | ICD-10-CM | POA: Diagnosis not present

## 2013-12-10 DIAGNOSIS — Z1212 Encounter for screening for malignant neoplasm of rectum: Secondary | ICD-10-CM | POA: Diagnosis not present

## 2014-02-24 ENCOUNTER — Ambulatory Visit: Payer: Self-pay | Admitting: Orthopedic Surgery

## 2014-02-24 DIAGNOSIS — M1711 Unilateral primary osteoarthritis, right knee: Secondary | ICD-10-CM | POA: Diagnosis not present

## 2014-02-24 NOTE — Progress Notes (Signed)
Preoperative surgical orders have been place into the Epic hospital system for Laura Lowery on 02/24/2014, 12:56 PM  by Mickel Crow for surgery on 03-28-2014.  Preop Total Knee orders including Experal, IV Tylenol, and IV Decadron as long as there are no contraindications to the above medications. Arlee Muslim, PA-C

## 2014-03-10 DIAGNOSIS — E785 Hyperlipidemia, unspecified: Secondary | ICD-10-CM | POA: Diagnosis not present

## 2014-03-10 DIAGNOSIS — M353 Polymyalgia rheumatica: Secondary | ICD-10-CM | POA: Diagnosis not present

## 2014-03-10 DIAGNOSIS — Z6829 Body mass index (BMI) 29.0-29.9, adult: Secondary | ICD-10-CM | POA: Diagnosis not present

## 2014-03-10 DIAGNOSIS — E559 Vitamin D deficiency, unspecified: Secondary | ICD-10-CM | POA: Diagnosis not present

## 2014-03-10 DIAGNOSIS — M859 Disorder of bone density and structure, unspecified: Secondary | ICD-10-CM | POA: Diagnosis not present

## 2014-03-10 DIAGNOSIS — E119 Type 2 diabetes mellitus without complications: Secondary | ICD-10-CM | POA: Diagnosis not present

## 2014-03-10 DIAGNOSIS — D519 Vitamin B12 deficiency anemia, unspecified: Secondary | ICD-10-CM | POA: Diagnosis not present

## 2014-03-10 DIAGNOSIS — M179 Osteoarthritis of knee, unspecified: Secondary | ICD-10-CM | POA: Diagnosis not present

## 2014-03-10 DIAGNOSIS — I10 Essential (primary) hypertension: Secondary | ICD-10-CM | POA: Diagnosis not present

## 2014-03-10 DIAGNOSIS — E1136 Type 2 diabetes mellitus with diabetic cataract: Secondary | ICD-10-CM | POA: Diagnosis not present

## 2014-03-21 NOTE — Patient Instructions (Addendum)
Laura Lowery  03/21/2014   Your procedure is scheduled on:  03/28/2014    Report to [facility] Main  Entrance and follow signs to               [place] at       0730 AM.  Call this number if you have problems the morning of surgery [phone]   Remember: Eat a good healthy snack prior to bedtime.   Do not eat food or drink liquids :After Midnight.     Take these medicines the morning of surgery with A SIP OF WATER: none                Take 1/2 of evening dose of Insulin nite before surgery                                You may not have any metal on your body including hair pins and              piercings  Do not wear jewelry, make-up, lotions, powders or perfumes., deodorant.               Do not wear nail polish.  Do not shave  48 hours prior to surgery.     Do not bring valuables to the hospital. [place].  Contacts, dentures or bridgework may not be worn into surgery.  Leave suitcase in the car. After surgery it may be brought to your room.         Special Instructions: coughing and deep breathing exercises, leg exercises               Please read over the following fact sheets you were given: _____________________________________________________________________             [facility] - Preparing for Surgery Before surgery, you can play an important role.  Because skin is not sterile, your skin needs to be as free of germs as possible.  You can reduce the number of germs on your skin by washing with CHG (chlorahexidine gluconate) soap before surgery.  CHG is an antiseptic cleaner which kills germs and bonds with the skin to continue killing germs even after washing. Please DO NOT use if you have an allergy to CHG or antibacterial soaps.  If your skin becomes reddened/irritated stop using the CHG and inform your nurse when you arrive at Short Stay. Do not shave (including legs and  underarms) for at least 48 hours prior to the first CHG shower.  You may shave your face/neck. Please follow these instructions carefully:  1.  Shower with CHG Soap the night before surgery and the  morning of Surgery.  2.  If you choose to wash your hair, wash your hair first as usual with your  normal  shampoo.  3.  After you shampoo, rinse your hair and body thoroughly to remove the  shampoo.                           4.  Use CHG as you would any other liquid soap.  You can apply chg directly  to the skin and wash  Gently with a scrungie or clean washcloth.  5.  Apply the CHG Soap to your body ONLY FROM THE NECK DOWN.   Do not use on face/ open                           Wound or open sores. Avoid contact with eyes, ears mouth and genitals (private parts).                       Wash face,  Genitals (private parts) with your normal soap.             6.  Wash thoroughly, paying special attention to the area where your surgery  will be performed.  7.  Thoroughly rinse your body with warm water from the neck down.  8.  DO NOT shower/wash with your normal soap after using and rinsing off  the CHG Soap.                9.  Pat yourself dry with a clean towel.            10.  Wear clean pajamas.            11.  Place clean sheets on your bed the night of your first shower and do not  sleep with pets. Day of Surgery : Do not apply any lotions/deodorants the morning of surgery.  Please wear clean clothes to the hospital/surgery center.  FAILURE TO FOLLOW THESE INSTRUCTIONS MAY RESULT IN THE CANCELLATION OF YOUR SURGERY PATIENT SIGNATURE_________________________________  NURSE SIGNATURE__________________________________  ________________________________________________________________________  WHAT IS A BLOOD TRANSFUSION? Blood Transfusion Information  A transfusion is the replacement of blood or some of its parts. Blood is made up of multiple cells which provide different  functions.  Red blood cells carry oxygen and are used for blood loss replacement.  White blood cells fight against infection.  Platelets control bleeding.  Plasma helps clot blood.  Other blood products are available for specialized needs, such as hemophilia or other clotting disorders. BEFORE THE TRANSFUSION  Who gives blood for transfusions?   Healthy volunteers who are fully evaluated to make sure their blood is safe. This is blood bank blood. Transfusion therapy is the safest it has ever been in the practice of medicine. Before blood is taken from a donor, a complete history is taken to make sure that person has no history of diseases nor engages in risky social behavior (examples are intravenous drug use or sexual activity with multiple partners). The donor's travel history is screened to minimize risk of transmitting infections, such as malaria. The donated blood is tested for signs of infectious diseases, such as HIV and hepatitis. The blood is then tested to be sure it is compatible with you in order to minimize the chance of a transfusion reaction. If you or a relative donates blood, this is often done in anticipation of surgery and is not appropriate for emergency situations. It takes many days to process the donated blood. RISKS AND COMPLICATIONS Although transfusion therapy is very safe and saves many lives, the main dangers of transfusion include:  1. Getting an infectious disease. 2. Developing a transfusion reaction. This is an allergic reaction to something in the blood you were given. Every precaution is taken to prevent this. The decision to have a blood transfusion has been considered carefully by your caregiver before blood is given. Blood is not given unless the benefits outweigh  the risks. AFTER THE TRANSFUSION  Right after receiving a blood transfusion, you will usually feel much better and more energetic. This is especially true if your red blood cells have gotten low  (anemic). The transfusion raises the level of the red blood cells which carry oxygen, and this usually causes an energy increase.  The nurse administering the transfusion will monitor you carefully for complications. HOME CARE INSTRUCTIONS  No special instructions are needed after a transfusion. You may find your energy is better. Speak with your caregiver about any limitations on activity for underlying diseases you may have. SEEK MEDICAL CARE IF:   Your condition is not improving after your transfusion.  You develop redness or irritation at the intravenous (IV) site. SEEK IMMEDIATE MEDICAL CARE IF:  Any of the following symptoms occur over the next 12 hours:  Shaking chills.  You have a temperature by mouth above 102 F (38.9 C), not controlled by medicine.  Chest, back, or muscle pain.  People around you feel you are not acting correctly or are confused.  Shortness of breath or difficulty breathing.  Dizziness and fainting.  You get a rash or develop hives.  You have a decrease in urine output.  Your urine turns a dark color or changes to pink, red, or brown. Any of the following symptoms occur over the next 10 days:  You have a temperature by mouth above 102 F (38.9 C), not controlled by medicine.  Shortness of breath.  Weakness after normal activity.  The white part of the eye turns yellow (jaundice).  You have a decrease in the amount of urine or are urinating less often.  Your urine turns a dark color or changes to pink, red, or brown. Document Released: 12/22/1999 Document Revised: 03/18/2011 Document Reviewed: 08/10/2007 Exit[employer] Patient Information 2014 [employer].  _______________________________________________________________________  Incentive Spirometer  An incentive spirometer is a tool that can help keep your lungs clear and active. This tool measures how well you are filling your lungs with each breath. Taking long deep breaths may help  reverse or decrease the chance of developing breathing (pulmonary) problems (especially infection) following:  A long period of time when you are unable to move or be active. BEFORE THE PROCEDURE   If the spirometer includes an indicator to show your best effort, your nurse or respiratory therapist will set it to a desired goal.  If possible, sit up straight or lean slightly forward. Try not to slouch.  Hold the incentive spirometer in an upright position. INSTRUCTIONS FOR USE  3. Sit on the edge of your bed if possible, or sit up as far as you can in bed or on a chair. 4. Hold the incentive spirometer in an upright position. 5. Breathe out normally. 6. Place the mouthpiece in your mouth and seal your lips tightly around it. 7. Breathe in slowly and as deeply as possible, raising the piston or the ball toward the top of the column. 8. Hold your breath for 3-5 seconds or for as long as possible. Allow the piston or ball to fall to the bottom of the column. 9. Remove the mouthpiece from your mouth and breathe out normally. 10. Rest for a few seconds and repeat Steps 1 through 7 at least 10 times every 1-2 hours when you are awake. Take your time and take a few normal breaths between deep breaths. 11. The spirometer may include an indicator to show your best effort. Use the indicator as a goal to work  toward during each repetition. 12. After each set of 10 deep breaths, practice coughing to be sure your lungs are clear. If you have an incision (the cut made at the time of surgery), support your incision when coughing by placing a pillow or rolled up towels firmly against it. Once you are able to get out of bed, walk around indoors and cough well. You may stop using the incentive spirometer when instructed by your caregiver.  RISKS AND COMPLICATIONS  Take your time so you do not get dizzy or light-headed.  If you are in pain, you may need to take or ask for pain medication before doing  incentive spirometry. It is harder to take a deep breath if you are having pain. AFTER USE  Rest and breathe slowly and easily.  It can be helpful to keep track of a log of your progress. Your caregiver can provide you with a simple table to help with this. If you are using the spirometer at home, follow these instructions: [place] IF:   You are having difficultly using the spirometer.  You have trouble using the spirometer as often as instructed.  Your pain medication is not giving enough relief while using the spirometer.  You develop fever of 100.5 F (38.1 C) or higher. SEEK IMMEDIATE MEDICAL CARE IF:   You cough up bloody sputum that had not been present before.  You develop fever of 102 F (38.9 C) or greater.  You develop worsening pain at or near the incision site. MAKE SURE YOU:   Understand these instructions.  Will watch your condition.  Will get help right away if you are not doing well or get worse. Document Released: 05/06/2006 Document Revised: 03/18/2011 Document Reviewed: 07/07/2006 [facility] Patient Information 2014 [place], [place].   ________________________________________________________________________

## 2014-03-22 ENCOUNTER — Other Ambulatory Visit: Payer: Self-pay

## 2014-03-22 ENCOUNTER — Encounter (HOSPITAL_COMMUNITY)
Admission: RE | Admit: 2014-03-22 | Discharge: 2014-03-22 | Disposition: A | Discharge status: Home / Self Care | Payer: Medicare Other | Source: Ambulatory Visit | Attending: Orthopedic Surgery | Admitting: Orthopedic Surgery

## 2014-03-22 ENCOUNTER — Encounter (HOSPITAL_COMMUNITY): Payer: Self-pay

## 2014-03-22 DIAGNOSIS — Z01812 Encounter for preprocedural laboratory examination: Secondary | ICD-10-CM | POA: Insufficient documentation

## 2014-03-22 DIAGNOSIS — Z01810 Encounter for preprocedural cardiovascular examination: Secondary | ICD-10-CM | POA: Diagnosis not present

## 2014-03-22 DIAGNOSIS — M1711 Unilateral primary osteoarthritis, right knee: Secondary | ICD-10-CM | POA: Diagnosis not present

## 2014-03-22 HISTORY — DX: Myoneural disorder, unspecified: G70.9

## 2014-03-22 HISTORY — DX: Stress incontinence (female) (male): N39.3

## 2014-03-22 HISTORY — DX: Respiratory tuberculosis unspecified: A15.9

## 2014-03-22 LAB — COMPREHENSIVE METABOLIC PANEL
ALBUMIN: 4.2 g/dL (ref 3.5–5.2)
ALK PHOS: 53 U/L (ref 39–117)
ALT: 16 U/L (ref 0–35)
AST: 20 U/L (ref 0–37)
Anion gap: 5 (ref 5–15)
BUN: 15 mg/dL (ref 6–23)
CO2: 28 mmol/L (ref 19–32)
Calcium: 9.5 mg/dL (ref 8.4–10.5)
Chloride: 106 mmol/L (ref 96–112)
Creatinine, Ser: 0.82 mg/dL (ref 0.50–1.10)
GFR calc non Af Amer: 64 mL/min — ABNORMAL LOW (ref >90)
GFR, EST AFRICAN AMERICAN: 75 mL/min — AB (ref >90)
GLUCOSE: 144 mg/dL — AB (ref 70–99)
POTASSIUM: 4 mmol/L (ref 3.5–5.1)
SODIUM: 139 mmol/L (ref 135–145)
TOTAL PROTEIN: 7.1 g/dL (ref 6.0–8.3)
Total Bilirubin: 0.3 mg/dL (ref 0.3–1.2)

## 2014-03-22 LAB — CBC
HCT: 39 % (ref 36.0–46.0)
HEMOGLOBIN: 12.9 g/dL (ref 12.0–15.0)
MCH: 29.9 pg (ref 26.0–34.0)
MCHC: 33.1 g/dL (ref 30.0–36.0)
MCV: 90.5 fL (ref 78.0–100.0)
PLATELETS: 321 10*3/uL (ref 150–400)
RBC: 4.31 MIL/uL (ref 3.87–5.11)
RDW: 12.5 % (ref 11.5–15.5)
WBC: 6.8 10*3/uL (ref 4.0–10.5)

## 2014-03-22 LAB — PROTIME-INR
INR: 1.04 (ref 0.00–1.49)
Prothrombin Time: 13.7 seconds (ref 11.6–15.2)

## 2014-03-22 LAB — URINALYSIS, ROUTINE W REFLEX MICROSCOPIC
Bilirubin Urine: NEGATIVE
GLUCOSE, UA: NEGATIVE mg/dL
Hgb urine dipstick: NEGATIVE
KETONES UR: NEGATIVE mg/dL
Leukocytes, UA: NEGATIVE
NITRITE: NEGATIVE
Protein, ur: NEGATIVE mg/dL
Specific Gravity, Urine: 1.01 (ref 1.005–1.030)
Urobilinogen, UA: 0.2 mg/dL (ref 0.0–1.0)
pH: 5.5 (ref 5.0–8.0)

## 2014-03-22 LAB — SURGICAL PCR SCREEN
MRSA, PCR: NEGATIVE
Staphylococcus aureus: POSITIVE — AB

## 2014-03-22 LAB — APTT: APTT: 30 s (ref 24–37)

## 2014-03-23 NOTE — H&P (Signed)
TOTAL KNEE ADMISSION H&P  Patient is being admitted for right total knee arthroplasty.  Subjective:  Chief Complaint:right knee pain.  HPI: Laura Lowery, 81 y.o. female, has a history of pain and functional disability in the right knee due to arthritis and has failed non-surgical conservative treatments for greater than 12 weeks to includeNSAID's and/or analgesics, corticosteriod injections and activity modification.  Onset of symptoms was gradual, starting 6 years ago with gradually worsening course since that time. The patient noted no past surgery on the right knee(s).  Patient currently rates pain in the right knee(s) at 8 out of 10 with activity. Patient has night pain, worsening of pain with activity and weight bearing, pain that interferes with activities of daily living, pain with passive range of motion, crepitus and joint swelling.  Patient has evidence of periarticular osteophytes and joint space narrowing by imaging studies.  There is no active infection.   Past Medical History  Diagnosis Date  . Hyperlipidemia   . DJD (degenerative joint disease)     c spine  . Osteoarthritis   . Cataracts, bilateral   . Hypertension   . Vitamin D deficiency   . Vitamin B 12 deficiency   . Diabetes   . Neuromuscular disorder     neuropathy in feet   . Tuberculosis     hx of positive TB since age 29   . Stress incontinence     Past Surgical History  Procedure Laterality Date  . Bone spur    . Abdominal hysterectomy    . Nasal reconstruction    . Breast reduction surgery    . Breast biopsy    . Cataract extraction      bilateral   . Colonoscopy        Current outpatient prescriptions:  .  benazepril (LOTENSIN) 10 MG tablet, Take 10 mg by mouth every morning. , Disp: , Rfl:  .  cholecalciferol (VITAMIN D) 1000 UNITS tablet, Take 1,000 Units by mouth daily., Disp: , Rfl:  .  cyanocobalamin (,VITAMIN B-12,) 1000 MCG/ML injection, Inject 1,000 mcg into the muscle every 30 (thirty)  days., Disp: , Rfl:  .  furosemide (LASIX) 20 MG tablet, Take 20 mg by mouth daily as needed for fluid. , Disp: , Rfl:  .  insulin glargine (LANTUS) 100 UNIT/ML injection, Inject 34 Units into the skin at bedtime., Disp: , Rfl:  .  meloxicam (MOBIC) 15 MG tablet, Take 7.5 mg by mouth daily as needed for pain., Disp: , Rfl:  .  metFORMIN (GLUCOPHAGE) 1000 MG tablet, Take 1,000 mg by mouth 2 (two) times daily with a meal., Disp: , Rfl:  .  pravastatin (PRAVACHOL) 40 MG tablet, Take 40 mg by mouth daily. , Disp: , Rfl: 3  No Known Allergies  History  Substance Use Topics  . Smoking status: Never Smoker   . Smokeless tobacco: Never Used  . Alcohol Use: Yes     Comment: RARE    Family History  Problem Relation Age of Onset  . Heart attack Mother   . Pancreatic cancer Sister   . COPD Father   . Liver cancer Sister   . Colon cancer Neg Hx      Review of Systems  Constitutional: Negative.   HENT: Positive for hearing loss. Negative for congestion, ear discharge, ear pain, nosebleeds, sore throat and tinnitus.   Eyes: Negative.   Respiratory: Negative.  Negative for stridor.   Cardiovascular: Negative.   Gastrointestinal: Positive for blood in  stool. Negative for heartburn, nausea, vomiting, abdominal pain, diarrhea, constipation and melena.  Genitourinary: Positive for frequency. Negative for dysuria, urgency, hematuria and flank pain.  Musculoskeletal: Positive for joint pain. Negative for myalgias, back pain, falls and neck pain.       Right knee pain  Skin: Negative.   Neurological: Negative.  Negative for headaches.  Endo/Heme/Allergies: Negative.   Psychiatric/Behavioral: Negative.     Objective:  Physical Exam  Constitutional: She is oriented to person, place, and time. She appears well-developed. No distress.  Overweight  HENT:  Head: Normocephalic and atraumatic.  Right Ear: External ear normal.  Left Ear: External ear normal.  Nose: Nose normal.  Mouth/Throat:  Oropharynx is clear and moist.  Eyes: Conjunctivae and EOM are normal.  Neck: Normal range of motion. Neck supple.  Cardiovascular: Normal rate, regular rhythm, normal heart sounds and intact distal pulses.   No murmur heard. Respiratory: Effort normal and breath sounds normal. No respiratory distress. She has no wheezes.  GI: Soft. Bowel sounds are normal. She exhibits no distension. There is no tenderness.  Musculoskeletal:       Right hip: Normal.       Left hip: Normal.       Right knee: She exhibits decreased range of motion and swelling. She exhibits no effusion and no erythema. Tenderness found. Medial joint line and lateral joint line tenderness noted.       Left knee: Normal.  Her right knee shows no swelling. Her range of motion is about 5 to 115-120. There is marked crepitus on range of motion. She is tender medial greater than lateral with no instability.  Neurological: She is alert and oriented to person, place, and time. She has normal strength and normal reflexes. No sensory deficit.  Skin: No rash noted. She is not diaphoretic. No erythema.  Psychiatric: She has a normal mood and affect. Her behavior is normal.    Vital signs in last 24 hours: Temp:  [98.1 F (36.7 C)] 98.1 F (36.7 C) (03/15 1601) Pulse Rate:  [99] 99 (03/15 1601) Resp:  [16] 16 (03/15 1601) BP: (114)/(70) 114/70 mmHg (03/15 1601) SpO2:  [96 %] 96 % (03/15 1601) Weight:  [74.844 kg (165 lb)] 74.844 kg (165 lb) (03/15 1601)  Labs:   Estimated body mass index is 28.46 kg/(m^2) as calculated from the following:   Height as of 02/25/13: 5\' 5"  (1.651 m).   Weight as of 02/25/13: 77.565 kg (171 lb).   Imaging Review Plain radiographs demonstrate severe degenerative joint disease of the right knee(s). The overall alignment ismild varus. The bone quality appears to be good for age and reported activity level.  Assessment/Plan:  End stage primary osteoarthritis, right knee   The patient history,  physical examination, clinical judgment of the provider and imaging studies are consistent with end stage degenerative joint disease of the right knee(s) and total knee arthroplasty is deemed medically necessary. The treatment options including medical management, injection therapy arthroscopy and arthroplasty were discussed at length. The risks and benefits of total knee arthroplasty were presented and reviewed. The risks due to aseptic loosening, infection, stiffness, patella tracking problems, thromboembolic complications and other imponderables were discussed. The patient acknowledged the explanation, agreed to proceed with the plan and consent was signed. Patient is being admitted for inpatient treatment for surgery, pain control, PT, OT, prophylactic antibiotics, VTE prophylaxis, progressive ambulation and ADL's and discharge planning. The patient is planning to be discharged to skilled nursing facility [facility])  PCP: Dr. Osborne Casco TXA IV   Ardeen Jourdain, PA-C

## 2014-03-24 NOTE — Progress Notes (Signed)
Left second message regarding positive nasal swab for staph on home phone with instructions.  Asked patient to call back to make sure she received message.

## 2014-03-24 NOTE — Progress Notes (Signed)
Final EKG done 03/22/14 in [facility].

## 2014-03-25 NOTE — Progress Notes (Signed)
Spoke with patient via phone and patient stated she started the Mupirocin ointment on 03/23/14.

## 2014-03-25 NOTE — Progress Notes (Signed)
Left a message on home phone of daughter- [phone] Jerolyn Shin  That I have left message on home phone of mother Laura Lowery and asked patient to call me back but she has not called me back.  Asked daughter to call me or have her mother give me a call at [phone].

## 2014-03-28 ENCOUNTER — Inpatient Hospital Stay (HOSPITAL_COMMUNITY)
Admission: RE | Admit: 2014-03-28 | Discharge: 2014-03-31 | DRG: 470 | Disposition: A | Discharge status: Skilled Nursing Facility | Payer: Medicare Other | Source: Ambulatory Visit | Attending: Orthopedic Surgery | Admitting: Orthopedic Surgery

## 2014-03-28 ENCOUNTER — Encounter (HOSPITAL_COMMUNITY)
Admission: RE | Disposition: A | Discharge status: Skilled Nursing Facility | Payer: Self-pay | Source: Ambulatory Visit | Attending: Orthopedic Surgery

## 2014-03-28 ENCOUNTER — Inpatient Hospital Stay (HOSPITAL_COMMUNITY): Payer: Medicare Other | Admitting: Anesthesiology

## 2014-03-28 ENCOUNTER — Encounter (HOSPITAL_COMMUNITY): Payer: Self-pay | Admitting: *Deleted

## 2014-03-28 DIAGNOSIS — R2681 Unsteadiness on feet: Secondary | ICD-10-CM | POA: Diagnosis not present

## 2014-03-28 DIAGNOSIS — M1711 Unilateral primary osteoarthritis, right knee: Secondary | ICD-10-CM | POA: Diagnosis not present

## 2014-03-28 DIAGNOSIS — M179 Osteoarthritis of knee, unspecified: Secondary | ICD-10-CM | POA: Diagnosis present

## 2014-03-28 DIAGNOSIS — Z01812 Encounter for preprocedural laboratory examination: Secondary | ICD-10-CM | POA: Diagnosis not present

## 2014-03-28 DIAGNOSIS — E663 Overweight: Secondary | ICD-10-CM | POA: Diagnosis present

## 2014-03-28 DIAGNOSIS — Z96651 Presence of right artificial knee joint: Secondary | ICD-10-CM | POA: Diagnosis not present

## 2014-03-28 DIAGNOSIS — E785 Hyperlipidemia, unspecified: Secondary | ICD-10-CM | POA: Diagnosis present

## 2014-03-28 DIAGNOSIS — E559 Vitamin D deficiency, unspecified: Secondary | ICD-10-CM | POA: Diagnosis present

## 2014-03-28 DIAGNOSIS — Z794 Long term (current) use of insulin: Secondary | ICD-10-CM | POA: Diagnosis not present

## 2014-03-28 DIAGNOSIS — Z6828 Body mass index (BMI) 28.0-28.9, adult: Secondary | ICD-10-CM

## 2014-03-28 DIAGNOSIS — M25561 Pain in right knee: Secondary | ICD-10-CM | POA: Diagnosis not present

## 2014-03-28 DIAGNOSIS — E538 Deficiency of other specified B group vitamins: Secondary | ICD-10-CM | POA: Diagnosis present

## 2014-03-28 DIAGNOSIS — R531 Weakness: Secondary | ICD-10-CM | POA: Diagnosis not present

## 2014-03-28 DIAGNOSIS — E119 Type 2 diabetes mellitus without complications: Secondary | ICD-10-CM | POA: Diagnosis present

## 2014-03-28 DIAGNOSIS — M2580 Other specified joint disorders, unspecified joint: Secondary | ICD-10-CM | POA: Diagnosis not present

## 2014-03-28 DIAGNOSIS — I10 Essential (primary) hypertension: Secondary | ICD-10-CM | POA: Diagnosis present

## 2014-03-28 DIAGNOSIS — Z471 Aftercare following joint replacement surgery: Secondary | ICD-10-CM | POA: Diagnosis not present

## 2014-03-28 DIAGNOSIS — R278 Other lack of coordination: Secondary | ICD-10-CM | POA: Diagnosis not present

## 2014-03-28 DIAGNOSIS — M1710 Unilateral primary osteoarthritis, unspecified knee: Secondary | ICD-10-CM | POA: Diagnosis present

## 2014-03-28 HISTORY — PX: TOTAL KNEE ARTHROPLASTY: SHX125

## 2014-03-28 LAB — GLUCOSE, CAPILLARY
GLUCOSE-CAPILLARY: 105 mg/dL — AB (ref 70–99)
Glucose-Capillary: 93 mg/dL (ref 70–99)
Glucose-Capillary: 109 mg/dL — ABNORMAL HIGH (ref 70–99)
Glucose-Capillary: 260 mg/dL — ABNORMAL HIGH (ref 70–99)
Glucose-Capillary: 210 mg/dL — ABNORMAL HIGH (ref 70–99)

## 2014-03-28 LAB — ABO/RH: ABO/RH(D): B POS

## 2014-03-28 LAB — TYPE AND SCREEN
ABO/RH(D): B POS
Antibody Screen: NEGATIVE

## 2014-03-28 SURGERY — ARTHROPLASTY, KNEE, TOTAL
Anesthesia: Spinal | Site: Knee | Laterality: Right

## 2014-03-28 MED ORDER — ONDANSETRON HCL 4 MG/2ML IJ SOLN
INTRAMUSCULAR | Status: DC | PRN
  Administered 2014-03-28: 4 mg via INTRAVENOUS

## 2014-03-28 MED ORDER — ACETAMINOPHEN 325 MG PO TABS: 650.0000 mg | ORAL_TABLET | Freq: Four times a day (QID) | ORAL | Status: DC | PRN

## 2014-03-28 MED ORDER — METOCLOPRAMIDE HCL 10 MG PO TABS: 5.0000 mg | ORAL_TABLET | Freq: Three times a day (TID) | ORAL | Status: DC | PRN

## 2014-03-28 MED ORDER — EPHEDRINE SULFATE 50 MG/ML IJ SOLN
INTRAMUSCULAR | Status: DC | PRN
Start: 2014-03-28 — End: 2014-03-28
  Administered 2014-03-28 (×2): 5 mg via INTRAVENOUS

## 2014-03-28 MED ORDER — SODIUM CHLORIDE 0.9 % IV SOLN: INTRAVENOUS | Status: DC

## 2014-03-28 MED ORDER — POLYETHYLENE GLYCOL 3350 17 G PO PACK
17.0000 g | PACK | Freq: Every day | ORAL | Status: DC | PRN
  Administered 2014-03-29 – 2014-03-30 (×3): 17 g via ORAL
  Filled 2014-03-28 (×3): qty 1

## 2014-03-28 MED ORDER — BUPIVACAINE HCL 0.25 % IJ SOLN
INTRAMUSCULAR | Status: DC | PRN
  Administered 2014-03-28: 20 mL

## 2014-03-28 MED ORDER — ACETAMINOPHEN 10 MG/ML IV SOLN
1000.0000 mg | Freq: Once | INTRAVENOUS | Status: AC
  Administered 2014-03-28: 1000 mg via INTRAVENOUS
  Filled 2014-03-28: qty 100

## 2014-03-28 MED ORDER — BUPIVACAINE IN DEXTROSE 0.75-8.25 % IT SOLN
INTRATHECAL | Status: DC | PRN
  Administered 2014-03-28: 1.6 mL via INTRATHECAL

## 2014-03-28 MED ORDER — CHLORHEXIDINE GLUCONATE 4 % EX LIQD: 60.0000 mL | Freq: Once | CUTANEOUS | Status: DC

## 2014-03-28 MED ORDER — ACETAMINOPHEN 650 MG RE SUPP: 650.0000 mg | Freq: Four times a day (QID) | RECTAL | Status: DC | PRN

## 2014-03-28 MED ORDER — CEFAZOLIN SODIUM-DEXTROSE 2-3 GM-% IV SOLR
2.0000 g | Freq: Four times a day (QID) | INTRAVENOUS | Status: AC
  Administered 2014-03-28 (×2): 2 g via INTRAVENOUS
  Filled 2014-03-28 (×2): qty 50

## 2014-03-28 MED ORDER — RIVAROXABAN 10 MG PO TABS
10.0000 mg | ORAL_TABLET | Freq: Every day | ORAL | Status: DC
  Administered 2014-03-29 – 2014-03-31 (×3): 10 mg via ORAL
  Filled 2014-03-28 (×4): qty 1

## 2014-03-28 MED ORDER — TRAMADOL HCL 50 MG PO TABS
50.0000 mg | ORAL_TABLET | Freq: Four times a day (QID) | ORAL | Status: DC | PRN
  Administered 2014-03-29: 50 mg via ORAL
  Filled 2014-03-28: qty 1

## 2014-03-28 MED ORDER — EPHEDRINE SULFATE 50 MG/ML IJ SOLN
INTRAMUSCULAR | Status: AC
  Filled 2014-03-28: qty 1

## 2014-03-28 MED ORDER — SODIUM CHLORIDE 0.9 % IV SOLN
1000.0000 mg | INTRAVENOUS | Status: AC
  Administered 2014-03-28: 1000 mg via INTRAVENOUS
  Filled 2014-03-28: qty 10

## 2014-03-28 MED ORDER — DOCUSATE SODIUM 100 MG PO CAPS
100.0000 mg | ORAL_CAPSULE | Freq: Two times a day (BID) | ORAL | Status: DC
  Administered 2014-03-28 – 2014-03-31 (×6): 100 mg via ORAL

## 2014-03-28 MED ORDER — DEXAMETHASONE SODIUM PHOSPHATE 10 MG/ML IJ SOLN
10.0000 mg | Freq: Once | INTRAMUSCULAR | Status: AC
  Administered 2014-03-28: 10 mg via INTRAVENOUS

## 2014-03-28 MED ORDER — BISACODYL 10 MG RE SUPP
10.0000 mg | Freq: Every day | RECTAL | Status: DC | PRN
  Filled 2014-03-28: qty 1

## 2014-03-28 MED ORDER — BUPIVACAINE LIPOSOME 1.3 % IJ SUSP
20.0000 mL | Freq: Once | INTRAMUSCULAR | Status: DC
  Filled 2014-03-28: qty 20

## 2014-03-28 MED ORDER — FLEET ENEMA 7-19 GM/118ML RE ENEM: 1.0000 | ENEMA | Freq: Once | RECTAL | Status: AC | PRN

## 2014-03-28 MED ORDER — MEPERIDINE HCL 50 MG/ML IJ SOLN: 6.2500 mg | INTRAMUSCULAR | Status: DC | PRN

## 2014-03-28 MED ORDER — PHENOL 1.4 % MT LIQD: 1.0000 | OROMUCOSAL | Status: DC | PRN

## 2014-03-28 MED ORDER — METOCLOPRAMIDE HCL 5 MG/ML IJ SOLN: 5.0000 mg | Freq: Three times a day (TID) | INTRAMUSCULAR | Status: DC | PRN

## 2014-03-28 MED ORDER — PROPOFOL 10 MG/ML IV BOLUS
INTRAVENOUS | Status: DC | PRN
  Administered 2014-03-28 (×2): 20 mg via INTRAVENOUS

## 2014-03-28 MED ORDER — INSULIN ASPART 100 UNIT/ML ~~LOC~~ SOLN
0.0000 [IU] | Freq: Three times a day (TID) | SUBCUTANEOUS | Status: DC
  Administered 2014-03-28 – 2014-03-29 (×2): 8 [IU] via SUBCUTANEOUS
  Administered 2014-03-29 (×2): 3 [IU] via SUBCUTANEOUS
  Administered 2014-03-30: 5 [IU] via SUBCUTANEOUS
  Administered 2014-03-30: 2 [IU] via SUBCUTANEOUS
  Administered 2014-03-31: 3 [IU] via SUBCUTANEOUS

## 2014-03-28 MED ORDER — DEXAMETHASONE SODIUM PHOSPHATE 10 MG/ML IJ SOLN
10.0000 mg | Freq: Once | INTRAMUSCULAR | Status: AC
  Administered 2014-03-29: 10 mg via INTRAVENOUS
  Filled 2014-03-28: qty 1

## 2014-03-28 MED ORDER — PROPOFOL 10 MG/ML IV BOLUS
INTRAVENOUS | Status: AC
  Filled 2014-03-28: qty 20

## 2014-03-28 MED ORDER — HYDROMORPHONE HCL 1 MG/ML IJ SOLN: 0.2500 mg | INTRAMUSCULAR | Status: DC | PRN

## 2014-03-28 MED ORDER — ACETAMINOPHEN 500 MG PO TABS
1000.0000 mg | ORAL_TABLET | Freq: Four times a day (QID) | ORAL | Status: AC
  Administered 2014-03-28 – 2014-03-29 (×3): 1000 mg via ORAL
  Filled 2014-03-28 (×4): qty 2

## 2014-03-28 MED ORDER — PROPOFOL INFUSION 10 MG/ML OPTIME
INTRAVENOUS | Status: DC | PRN
  Administered 2014-03-28: 75 ug/kg/min via INTRAVENOUS

## 2014-03-28 MED ORDER — SODIUM CHLORIDE 0.9 % IJ SOLN
INTRAMUSCULAR | Status: DC | PRN
  Administered 2014-03-28: 30 mL

## 2014-03-28 MED ORDER — DEXTROSE 5 % IV SOLN
500.0000 mg | Freq: Four times a day (QID) | INTRAVENOUS | Status: DC | PRN
  Filled 2014-03-28: qty 5

## 2014-03-28 MED ORDER — LACTATED RINGERS IV SOLN
INTRAVENOUS | Status: DC
  Administered 2014-03-28: 1000 mL via INTRAVENOUS
  Administered 2014-03-28: 11:00:00 via INTRAVENOUS

## 2014-03-28 MED ORDER — MIDAZOLAM HCL 5 MG/5ML IJ SOLN
INTRAMUSCULAR | Status: DC | PRN
  Administered 2014-03-28: 2 mg via INTRAVENOUS

## 2014-03-28 MED ORDER — MIDAZOLAM HCL 2 MG/2ML IJ SOLN
INTRAMUSCULAR | Status: AC
  Filled 2014-03-28: qty 2

## 2014-03-28 MED ORDER — BUPIVACAINE HCL (PF) 0.25 % IJ SOLN
INTRAMUSCULAR | Status: AC
  Filled 2014-03-28: qty 30

## 2014-03-28 MED ORDER — INSULIN GLARGINE 100 UNIT/ML ~~LOC~~ SOLN
34.0000 [IU] | Freq: Every day | SUBCUTANEOUS | Status: DC
  Administered 2014-03-29 – 2014-03-30 (×2): 34 [IU] via SUBCUTANEOUS
  Filled 2014-03-28 (×2): qty 0.34

## 2014-03-28 MED ORDER — SODIUM CHLORIDE 0.9 % IR SOLN
Status: DC | PRN
  Administered 2014-03-28: 1000 mL

## 2014-03-28 MED ORDER — ONDANSETRON HCL 4 MG/2ML IJ SOLN: 4.0000 mg | Freq: Once | INTRAMUSCULAR | Status: DC | PRN

## 2014-03-28 MED ORDER — CEFAZOLIN SODIUM-DEXTROSE 2-3 GM-% IV SOLR
INTRAVENOUS | Status: AC
  Filled 2014-03-28: qty 50

## 2014-03-28 MED ORDER — POTASSIUM CHLORIDE IN NACL 20-0.9 MEQ/L-% IV SOLN
INTRAVENOUS | Status: DC
  Administered 2014-03-28: 14:00:00 via INTRAVENOUS
  Filled 2014-03-28 (×4): qty 1000

## 2014-03-28 MED ORDER — ONDANSETRON HCL 4 MG/2ML IJ SOLN: 4.0000 mg | Freq: Four times a day (QID) | INTRAMUSCULAR | Status: DC | PRN

## 2014-03-28 MED ORDER — METHOCARBAMOL 500 MG PO TABS
500.0000 mg | ORAL_TABLET | Freq: Four times a day (QID) | ORAL | Status: DC | PRN
  Administered 2014-03-29 – 2014-03-31 (×5): 500 mg via ORAL
  Filled 2014-03-28 (×4): qty 1

## 2014-03-28 MED ORDER — ONDANSETRON HCL 4 MG PO TABS: 4.0000 mg | ORAL_TABLET | Freq: Four times a day (QID) | ORAL | Status: DC | PRN

## 2014-03-28 MED ORDER — FUROSEMIDE 20 MG PO TABS
20.0000 mg | ORAL_TABLET | Freq: Every day | ORAL | Status: DC | PRN
  Filled 2014-03-28: qty 1

## 2014-03-28 MED ORDER — METFORMIN HCL 500 MG PO TABS
1000.0000 mg | ORAL_TABLET | Freq: Two times a day (BID) | ORAL | Status: DC
  Administered 2014-03-29 – 2014-03-31 (×4): 1000 mg via ORAL
  Filled 2014-03-28 (×7): qty 2

## 2014-03-28 MED ORDER — FENTANYL CITRATE 0.05 MG/ML IJ SOLN
INTRAMUSCULAR | Status: AC
  Filled 2014-03-28: qty 2

## 2014-03-28 MED ORDER — SODIUM CHLORIDE 0.9 % IJ SOLN
INTRAMUSCULAR | Status: AC
  Filled 2014-03-28: qty 50

## 2014-03-28 MED ORDER — BUPIVACAINE LIPOSOME 1.3 % IJ SUSP
INTRAMUSCULAR | Status: DC | PRN
  Administered 2014-03-28: 20 mL

## 2014-03-28 MED ORDER — MENTHOL 3 MG MT LOZG
1.0000 | LOZENGE | OROMUCOSAL | Status: DC | PRN
Start: 2014-03-28 — End: 2014-03-31

## 2014-03-28 MED ORDER — DIPHENHYDRAMINE HCL 12.5 MG/5ML PO ELIX: 12.5000 mg | ORAL_SOLUTION | ORAL | Status: DC | PRN

## 2014-03-28 MED ORDER — OXYCODONE HCL 5 MG PO TABS
5.0000 mg | ORAL_TABLET | ORAL | Status: DC | PRN
  Administered 2014-03-28 (×3): 5 mg via ORAL
  Administered 2014-03-29 – 2014-03-31 (×12): 10 mg via ORAL
  Filled 2014-03-28 (×5): qty 2
  Filled 2014-03-28 (×2): qty 1
  Filled 2014-03-28 (×6): qty 2
  Filled 2014-03-28: qty 1
  Filled 2014-03-28: qty 2

## 2014-03-28 MED ORDER — SODIUM CHLORIDE 0.9 % IJ SOLN
INTRAMUSCULAR | Status: AC
  Filled 2014-03-28: qty 10

## 2014-03-28 MED ORDER — FENTANYL CITRATE 0.05 MG/ML IJ SOLN
INTRAMUSCULAR | Status: DC | PRN
  Administered 2014-03-28 (×2): 50 ug via INTRAVENOUS

## 2014-03-28 MED ORDER — CEFAZOLIN SODIUM-DEXTROSE 2-3 GM-% IV SOLR
2.0000 g | INTRAVENOUS | Status: AC
  Administered 2014-03-28: 2 g via INTRAVENOUS

## 2014-03-28 MED ORDER — MORPHINE SULFATE 2 MG/ML IJ SOLN: 1.0000 mg | INTRAMUSCULAR | Status: DC | PRN

## 2014-03-28 SURGICAL SUPPLY — 62 items
BAG DECANTER FOR FLEXI CONT (MISCELLANEOUS) ×3 IMPLANT
BAG ZIPLOCK 12X15 (MISCELLANEOUS) ×3 IMPLANT
BANDAGE ELASTIC 6 VELCRO ST LF (GAUZE/BANDAGES/DRESSINGS) ×3 IMPLANT
BANDAGE ESMARK 6X9 LF (GAUZE/BANDAGES/DRESSINGS) ×1 IMPLANT
BLADE SAG 18X100X1.27 (BLADE) ×3 IMPLANT
BLADE SAW SGTL 11.0X1.19X90.0M (BLADE) ×3 IMPLANT
BNDG ESMARK 6X9 LF (GAUZE/BANDAGES/DRESSINGS) ×3
BOWL SMART MIX CTS (DISPOSABLE) ×3 IMPLANT
CAP KNEE TOTAL 3 SIGMA ×3 IMPLANT
CEMENT HV SMART SET (Cement) ×6 IMPLANT
CLOSURE WOUND 1/2 X4 (GAUZE/BANDAGES/DRESSINGS) ×1
CUFF TOURN SGL QUICK 34 (TOURNIQUET CUFF) ×2
CUFF TRNQT CYL 34X4X40X1 (TOURNIQUET CUFF) ×1 IMPLANT
DECANTER SPIKE VIAL GLASS SM (MISCELLANEOUS) ×3 IMPLANT
DRAPE EXTREMITY T 121X128X90 (DRAPE) ×3 IMPLANT
DRAPE POUCH INSTRU U-SHP 10X18 (DRAPES) ×3 IMPLANT
DRAPE U-SHAPE 47X51 STRL (DRAPES) ×3 IMPLANT
DRSG ADAPTIC 3X8 NADH LF (GAUZE/BANDAGES/DRESSINGS) ×3 IMPLANT
DRSG PAD ABDOMINAL 8X10 ST (GAUZE/BANDAGES/DRESSINGS) IMPLANT
DURAPREP 26ML APPLICATOR (WOUND CARE) ×3 IMPLANT
ELECT REM PT RETURN 9FT ADLT (ELECTROSURGICAL) ×3
ELECTRODE REM PT RTRN 9FT ADLT (ELECTROSURGICAL) ×1 IMPLANT
EVACUATOR 1/8 PVC DRAIN (DRAIN) ×3 IMPLANT
FACESHIELD WRAPAROUND (MASK) ×15 IMPLANT
GAUZE SPONGE 4X4 12PLY STRL (GAUZE/BANDAGES/DRESSINGS) ×3 IMPLANT
GLOVE BIO SURGEON STRL SZ7.5 (GLOVE) IMPLANT
GLOVE BIO SURGEON STRL SZ8 (GLOVE) ×3 IMPLANT
GLOVE BIOGEL PI IND STRL 6.5 (GLOVE) IMPLANT
GLOVE BIOGEL PI IND STRL 8 (GLOVE) ×1 IMPLANT
GLOVE BIOGEL PI INDICATOR 6.5 (GLOVE)
GLOVE BIOGEL PI INDICATOR 8 (GLOVE) ×2
GLOVE SURG SS PI 6.5 STRL IVOR (GLOVE) IMPLANT
GOWN STRL REUS W/TWL LRG LVL3 (GOWN DISPOSABLE) ×3 IMPLANT
GOWN STRL REUS W/TWL XL LVL3 (GOWN DISPOSABLE) ×3 IMPLANT
HANDPIECE INTERPULSE COAX TIP (DISPOSABLE) ×2
IMMOBILIZER KNEE 20 (SOFTGOODS) ×3 IMPLANT
IMMOBILIZER KNEE 20 THIGH 36 (SOFTGOODS) IMPLANT
KIT BASIN OR (CUSTOM PROCEDURE TRAY) ×3 IMPLANT
MANIFOLD NEPTUNE II (INSTRUMENTS) ×3 IMPLANT
NDL SAFETY ECLIPSE 18X1.5 (NEEDLE) ×2 IMPLANT
NEEDLE HYPO 18GX1.5 SHARP (NEEDLE) ×4
NS IRRIG 1000ML POUR BTL (IV SOLUTION) ×3 IMPLANT
PACK TOTAL JOINT (CUSTOM PROCEDURE TRAY) ×3 IMPLANT
PAD ABD 8X10 STRL (GAUZE/BANDAGES/DRESSINGS) ×3 IMPLANT
PADDING CAST COTTON 6X4 STRL (CAST SUPPLIES) ×3 IMPLANT
PEN SKIN MARKING BROAD (MISCELLANEOUS) ×3 IMPLANT
POSITIONER SURGICAL ARM (MISCELLANEOUS) ×3 IMPLANT
SET HNDPC FAN SPRY TIP SCT (DISPOSABLE) ×1 IMPLANT
STRIP CLOSURE SKIN 1/2X4 (GAUZE/BANDAGES/DRESSINGS) ×2 IMPLANT
SUCTION FRAZIER 12FR DISP (SUCTIONS) ×3 IMPLANT
SUT MNCRL AB 4-0 PS2 18 (SUTURE) ×3 IMPLANT
SUT VIC AB 2-0 CT1 27 (SUTURE) ×6
SUT VIC AB 2-0 CT1 TAPERPNT 27 (SUTURE) ×3 IMPLANT
SUT VLOC 180 0 24IN GS25 (SUTURE) ×3 IMPLANT
SYR 20CC LL (SYRINGE) ×3 IMPLANT
SYR 50ML LL SCALE MARK (SYRINGE) ×3 IMPLANT
TOWEL OR 17X26 10 PK STRL BLUE (TOWEL DISPOSABLE) ×3 IMPLANT
TOWEL OR NON WOVEN STRL DISP B (DISPOSABLE) ×3 IMPLANT
TRAY FOLEY CATH 14FRSI W/METER (CATHETERS) ×3 IMPLANT
WATER STERILE IRR 1500ML POUR (IV SOLUTION) ×3 IMPLANT
WRAP KNEE MAXI GEL POST OP (GAUZE/BANDAGES/DRESSINGS) ×3 IMPLANT
YANKAUER SUCT BULB TIP 10FT TU (MISCELLANEOUS) ×3 IMPLANT

## 2014-03-28 NOTE — Op Note (Signed)
Pre-operative diagnosis- Osteoarthritis Right knee(s)  Post-operative diagnosis- Osteoarthritis  Right knee(s)  Procedure-   Right Total Knee Arthroplasty  Surgeon- Dione Plover. Thoren Hosang, MD  Assistant- Arlee Muslim, PA-C   Anesthesia-  Spinal   EBL- * No blood loss amount entered *   Drains Hemovac   Tourniquet time  Total Tourniquet Time Documented: Thigh (Right) - 28 minutes Total: Thigh (Right) - 28 minutes    Complications- None  Condition-PACU - hemodynamically stable.   Brief Clinical Note  Laura Lowery is a 81 y.o. year old female with end stage OA of her right knee with progressively worsening pain and dysfunction. She has constant pain, with activity and at rest and significant functional deficits with difficulties even with ADLs. She has had extensive non-op management including analgesics, injections of cortisone and viscosupplements, and home exercise program, but remains in significant pain with significant dysfunction.Radiographs show bone on bone arthritis lateral and patellofemoral with large valgus deformity. She presents now for right Total Knee Arthroplasty.    Procedure in detail---       The patient is brought into the operating room and positioned supine on the operating table. After successful administration of Spinal anesthetic, a tourniquet is placed high on the Right thigh(s) and the lower extremity is prepped and draped in the usual sterile fashion. Time out is performed by the operating team and then the Right  lower extremity is wrapped in Esmarch, knee flexed and the tourniquet inflated to 300 mmHg.       A midline incision is made with a ten blade through the subcutaneous tissue to the level of the extensor mechanism. A fresh blade is used to make a lateral parapatellar arthrotomy due to the patients' valgus deformity. Soft tissue over the proximal lateral tibia is subperiosteally elevated to the joint line with a knife to the posterolateral corner but not  including the structures of the posterolateral corner. Soft tissue over the proximal medial tibia is elevated with attention being paid to avoiding the patellar tendon on the tibial tubercle. The patella is everted medially, knee flexed 90 degrees and the ACL and PCL are removed. Findings are bone on bone lateral and patellofemoral with large global osteophytes. .       The drill is used to create a starting hole in the distal femur and the canal is thoroughly irrigated with sterile saline to remove the fatty contents. The 5 degree Right  valgus alignment guide is placed into the femoral canal and the distal femoral cutting block is pinned to remove 10  mm off the distal femur. Resection is made with an oscillating saw.      The tibia is subluxed forward and the menisci are removed. The extramedullary alignment guide is placed referencing proximally at the medial aspect of the tibial tubercle and distally along the second metatarsal axis and tibial crest. The block is pinned to remove 68mm off the more deficient lateral side. Resection is made with an oscillating saw. Size 4  is the most appropriate size for the tibia and the proximal tibia is prepared with the modular drill and keel punch for that size.      The femoral sizing guide is placed and size 4  is most appropriate. Rotation is marked off the epicondylar axis and confirmed by creating a rectangular flexion gap at 90 degrees. The size 4  cutting block is pinned in this rotation and the anterior, posterior and chamfer cuts are made with the oscillating saw.  The intercondylar block is then placed and that cut is made.      Trial size 4  tibial component, trial size 4 narrow posterior stabilized femur and a 12.5  mm posterior stabilized rotating platform insert trial is placed. Full extension is achieved with excellent varus/valgus and   anterior/posterior balance throughout full range of motion. The patella is everted and thickness measured to be 22  mm.  Free hand resection is taken to 12 mm, a 35 template is placed, lug holes are drilled, trial patella is placed, and it tracks normally. Osteophytes are removed off the posterior femur with the trial in place. All trials are removed and the cut bone surfaces prepared with pulsatile lavage. Cement is mixed and once ready for implantation, the size 4  tibial implant, size 4 narrow posterior stabilized femoral component, and the size 35  patella are cemented in place and the patella is held with the clamp. The trial insert is placed and the knee held in full extension. The Exparel (20 ml mixed with 30 ml saline) and then 20 ml of .25% Bupivicaine is injected into the extensor mechanism, posterior capsule, medial and lateral gutters and subcutaneous tissues. All extruded cement is removed and once the cement is hard the permanent 12.5  mm posterior stabilized rotating platform insert is placed into the tibial tray.      The wound is copiously irrigated with saline solution and the tourniquet is released for a total   tourniquet time of 28  minutes. Bleeding is identified and controlled with electrocautery. The extensor mechanism is closed with interrupted #1 V-loc leaving open a small area from the superior to inferior pole of the patella to serve as a mini lateral release. Flexion against gravity is 140  degrees and the patella tracks normally. Subcutaneous tissue is closed with 2.0 vicryl and subcuticular with running 4.0 Monocryl.The incision is cleaned and dried and steri-strips and a bulky sterile dressing are applied. The limb is placed into a knee immobilizer and the patient is awakened and transported to recovery in stable condition.      Please note that a surgical assistant was a medical necessity for this procedure in order to perform it in a safe and expeditious manner. Surgical assistant was necessary to retract the ligaments and vital neurovascular structures to prevent injury to them and also necessary  for proper positioning of the limb to allow for anatomic placement of the prosthesis.    Dione Plover Cantrell Larouche, MD    03/28/2014, 11:06 AM

## 2014-03-28 NOTE — Transfer of Care (Signed)
Immediate Anesthesia Transfer of Care Note  Patient: Laura Lowery  Procedure(s) Performed: Procedure(s): RIGHT TOTAL KNEE ARTHROPLASTY (Right)  Patient Location: PACU  Anesthesia Type:Spinal  Level of Consciousness: awake, alert  and oriented  Airway & Oxygen Therapy: Patient Spontanous Breathing and Patient connected to nasal cannula oxygen  Post-op Assessment: Report given to RN and Post -op Vital signs reviewed and stable  Post vital signs: Reviewed and stable  Last Vitals:  Filed Vitals:   03/28/14 0725  BP: 127/60  Pulse: 79  Temp: 36.4 C  Resp: 18    Complications: No apparent anesthesia complications

## 2014-03-28 NOTE — Progress Notes (Signed)
Utilization review completed.  

## 2014-03-28 NOTE — Anesthesia Preprocedure Evaluation (Signed)
Anesthesia Evaluation  Patient identified by MRN, date of birth, ID band Patient awake    Reviewed: Allergy & Precautions, NPO status , Patient's Chart, lab work & pertinent test results  Airway Mallampati: I  TM Distance: >3 FB Neck ROM: Full    Dental   Pulmonary          Cardiovascular hypertension,     Neuro/Psych    GI/Hepatic   Endo/Other  diabetes, Well Controlled, Type 2, Oral Hypoglycemic Agents, Insulin Dependent  Renal/GU      Musculoskeletal   Abdominal   Peds  Hematology   Anesthesia Other Findings   Reproductive/Obstetrics                             Anesthesia Physical Anesthesia Plan  ASA: II  Anesthesia Plan: Spinal   Post-op Pain Management:    Induction: Intravenous  Airway Management Planned: Natural Airway  Additional Equipment:   Intra-op Plan:   Post-operative Plan:   Informed Consent: I have reviewed the patients History and Physical, chart, labs and discussed the procedure including the risks, benefits and alternatives for the proposed anesthesia with the patient or authorized representative who has indicated his/her understanding and acceptance.     Plan Discussed with: CRNA and Surgeon  Anesthesia Plan Comments:         Anesthesia Quick Evaluation

## 2014-03-28 NOTE — Anesthesia Postprocedure Evaluation (Signed)
Anesthesia Post Note  Patient: Laura Lowery  Procedure(s) Performed: Procedure(s) (LRB): RIGHT TOTAL KNEE ARTHROPLASTY (Right)  Anesthesia type: spinal  Patient location: PACU  Post pain: Pain level controlled  Post assessment: Patient's Cardiovascular Status Stable  Last Vitals:  Filed Vitals:   03/28/14 1407  BP: 126/62  Pulse: 56  Temp: 36.4 C  Resp: 16    Post vital signs: Reviewed and stable  Level of consciousness: awake  Complications: No apparent anesthesia complications

## 2014-03-28 NOTE — Anesthesia Procedure Notes (Signed)
Spinal Patient location during procedure: OR Start time: 03/28/2014 10:06 AM End time: 03/28/2014 10:12 AM Staffing Resident/CRNA: Danley Danker L Performed by: resident/CRNA  Preanesthetic Checklist Completed: patient identified, site marked, surgical consent, pre-op evaluation, timeout performed, IV checked, risks and benefits discussed and monitors and equipment checked Spinal Block Patient position: sitting Patient monitoring: continuous pulse ox, blood pressure and heart rate Approach: midline Location: L3-4 Injection technique: single-shot Needle Needle type: Sprotte  Needle gauge: 24 G Needle length: 9 cm Assessment Sensory level: T6 Additional Notes Kit expiration date 07/2015 and kit lot # [id number] CSF clear, negative heme, negative paresthesia,  Tolerated well and returned to supine position

## 2014-03-28 NOTE — Interval H&P Note (Signed)
History and Physical Interval Note:  03/28/2014 9:29 AM  Laura Lowery  has presented today for surgery, with the diagnosis of OA RIGHT KNEE  The various methods of treatment have been discussed with the patient and family. After consideration of risks, benefits and other options for treatment, the patient has consented to  Procedure(s): RIGHT TOTAL KNEE ARTHROPLASTY (Right) as a surgical intervention .  The patient's history has been reviewed, patient examined, no change in status, stable for surgery.  I have reviewed the patient's chart and labs.  Questions were answered to the patient's satisfaction.     [name]

## 2014-03-29 LAB — BASIC METABOLIC PANEL
ANION GAP: 9 (ref 5–15)
BUN: 17 mg/dL (ref 6–23)
CHLORIDE: 103 mmol/L (ref 96–112)
CO2: 25 mmol/L (ref 19–32)
Calcium: 8.6 mg/dL (ref 8.4–10.5)
Creatinine, Ser: 0.89 mg/dL (ref 0.50–1.10)
GFR calc Af Amer: 68 mL/min — ABNORMAL LOW (ref >90)
GFR calc non Af Amer: 58 mL/min — ABNORMAL LOW (ref >90)
Glucose, Bld: 189 mg/dL — ABNORMAL HIGH (ref 70–99)
POTASSIUM: 4.5 mmol/L (ref 3.5–5.1)
Sodium: 137 mmol/L (ref 135–145)

## 2014-03-29 LAB — CBC
HEMATOCRIT: 32.1 % — AB (ref 36.0–46.0)
Hemoglobin: 10.7 g/dL — ABNORMAL LOW (ref 12.0–15.0)
MCH: 30.1 pg (ref 26.0–34.0)
MCHC: 33.3 g/dL (ref 30.0–36.0)
MCV: 90.2 fL (ref 78.0–100.0)
PLATELETS: 270 10*3/uL (ref 150–400)
RBC: 3.56 MIL/uL — ABNORMAL LOW (ref 3.87–5.11)
RDW: 12.8 % (ref 11.5–15.5)
WBC: 12.9 10*3/uL — AB (ref 4.0–10.5)

## 2014-03-29 LAB — GLUCOSE, CAPILLARY
GLUCOSE-CAPILLARY: 200 mg/dL — AB (ref 70–99)
GLUCOSE-CAPILLARY: 289 mg/dL — AB (ref 70–99)
Glucose-Capillary: 160 mg/dL — ABNORMAL HIGH (ref 70–99)
Glucose-Capillary: 266 mg/dL — ABNORMAL HIGH (ref 70–99)

## 2014-03-29 NOTE — Progress Notes (Signed)
   Subjective: 1 Day Post-Op Procedure(s) (LRB): RIGHT TOTAL KNEE ARTHROPLASTY (Right) Patient reports pain as mild.   Patient seen in rounds with Dr. Wynelle Link. Not much sleep. Patient is well, but has had some minor complaints of pain in the knee, requiring pain medications We will start therapy today.  Plan is to go [facility] after hospital stay.  Objective: Vital signs in last 24 hours: Temp:  [97.3 F (36.3 C)-98.4 F (36.9 C)] 97.7 F (36.5 C) (03/22 0555) Pulse Rate:  [55-70] 66 (03/22 0555) Resp:  [12-18] 18 (03/22 0555) BP: (96-152)/(53-83) 125/71 mmHg (03/22 0555) SpO2:  [96 %-100 %] 98 % (03/22 0555) Weight:  [74.844 kg (165 lb)] 74.844 kg (165 lb) (03/21 0837)  Intake/Output from previous day:  Intake/Output Summary (Last 24 hours) at 03/29/14 0832 Last data filed at 03/29/14 0555  Gross per 24 hour  Intake 2146.25 ml  Output   2575 ml  Net -428.75 ml    Intake/Output this shift: UOP 1100 since around [place]  Labs:  Recent Labs  03/29/14 0604  HGB 10.7*    Recent Labs  03/29/14 0604  WBC 12.9*  RBC 3.56*  HCT 32.1*  PLT 270    Recent Labs  03/29/14 0604  NA 137  K 4.5  CL 103  CO2 25  BUN 17  CREATININE 0.89  GLUCOSE 189*  CALCIUM 8.6   No results for input(s): LABPT, INR in the last 72 hours.  EXAM General - Patient is Alert and Appropriate Extremity - Neurovascular intact Sensation intact distally Dorsiflexion/Plantar flexion intact Dressing - dressing C/D/I Motor Function - intact, moving foot and toes well on exam.  Hemovac pulled without difficulty.  Past Medical History  Diagnosis Date  . Hyperlipidemia   . DJD (degenerative joint disease)     c spine  . Osteoarthritis   . Cataracts, bilateral   . Hypertension   . Vitamin D deficiency   . Vitamin B 12 deficiency   . Diabetes   . Neuromuscular disorder     neuropathy in feet   . Tuberculosis     hx of positive TB since age 64   . Stress incontinence      Assessment/Plan: 1 Day Post-Op Procedure(s) (LRB): RIGHT TOTAL KNEE ARTHROPLASTY (Right) Principal Problem:   OA (osteoarthritis) of knee  Estimated body mass index is 28.31 kg/(m^2) as calculated from the following:   Height as of this encounter: 5\' 4"  (1.626 m).   Weight as of this encounter: 74.844 kg (165 lb). Advance diet Up with therapy Discharge to SNF either Wed or Thurs  DVT Prophylaxis - Xarelto Weight-Bearing as tolerated to right leg D/C O2 and Pulse OX and try on Room Air  Arlee Muslim, PA-C Orthopaedic Surgery 03/29/2014, 8:32 AM

## 2014-03-29 NOTE — Progress Notes (Signed)
Physical Therapy Treatment Patient Details Name: Laura Lowery MRN: [id number] DOB: September 11, 1928 Today's Date: 03/29/2014    History of Present Illness 81 yo female s/p R TKA 03/28/14. Hx of DM, peripheral neuropathy.     PT Comments    Progressing with mobility. Plan is for SNF  Follow Up Recommendations  SNF     Equipment Recommendations  Rolling walker with 5" wheels    Recommendations for Other Services       Precautions / Restrictions Precautions Precautions: Knee Required Braces or Orthoses: Knee Immobilizer - Right Knee Immobilizer - Right: Discontinue once straight leg raise with < 10 degree lag Restrictions Weight Bearing Restrictions: No RLE Weight Bearing: Weight bearing as tolerated    Mobility  Bed Mobility Overal bed mobility: Needs Assistance Bed Mobility: Sit to Supine     Supine to sit: Min assist Sit to supine: Min assist   General bed mobility comments: Assist for R LE.  VCs safety, technique.   Transfers Overall transfer level: Needs assistance Equipment used: Rolling walker (2 wheeled) Transfers: Sit to/from Stand Sit to Stand: Min guard         General transfer comment: close guard for safety. VCs safety, technique, hand placement.   Ambulation/Gait Ambulation/Gait assistance: Min guard Ambulation Distance (Feet): 75 Feet Assistive device: Rolling walker (2 wheeled) Gait Pattern/deviations: Step-to pattern;Decreased stride length;Trunk flexed;Antalgic     General Gait Details: VCs safety, technique, sequence. close guard for safety   Stairs            Wheelchair Mobility    Modified Rankin (Stroke Patients Only)       Balance                                    Cognition Arousal/Alertness: Awake/alert Behavior During Therapy: WFL for tasks assessed/performed Overall Cognitive Status: Within Functional Limits for tasks assessed                      Exercises Total Joint Exercises Ankle  Circles/Pumps: AROM;Both;10 reps;Supine Quad Sets: AROM;Both;10 reps;Supine Heel Slides: AROM;Right;10 reps;Supine Straight Leg Raises: AROM;Right;10 reps;Supine Goniometric ROM: 10-45 degrees    General Comments        Pertinent Vitals/Pain Pain Assessment: 0-10 Pain Score: 5  Pain Location: R knee/thigh Pain Descriptors / Indicators: Aching Pain Intervention(s): Monitored during session;Repositioned    Home Living                      Prior Function            PT Goals (current goals can now be found in the care plan section) Acute Rehab PT Goals Patient Stated Goal: to regain independence PT Goal Formulation: With patient Time For Goal Achievement: 04/05/14 Potential to Achieve Goals: Good Progress towards PT goals: Progressing toward goals    Frequency  7X/week    PT Plan Current plan remains appropriate    Co-evaluation             End of Session Equipment Utilized During Treatment: Gait belt;Right knee immobilizer Activity Tolerance: Patient tolerated treatment well Patient left: in bed;with call bell/phone within reach (awaiting CPM so did not put ice on knee)     Time: 4481-8563 PT Time Calculation (min) (ACUTE ONLY): 22 min  Charges:  $Gait Training: 8-22 mins  G Codes:      Weston Anna, MPT Pager: [phone]

## 2014-03-29 NOTE — Evaluation (Signed)
Physical Therapy Evaluation Patient Details Name: Laura Lowery MRN: [id number] DOB: 10-Aug-1928 Today's Date: 03/29/2014   History of Present Illness  81 yo female s/p R TKA 03/28/14. Hx of DM, peripheral neuropathy.   Clinical Impression  On eval, pt required Min assist for mobility-able to ambulate ~50 feet with RW. Recommend ST rehab at SNF    Follow Up Recommendations SNF    Equipment Recommendations  Rolling walker with 5" wheels    Recommendations for Other Services       Precautions / Restrictions Precautions Precautions: Knee Required Braces or Orthoses: Knee Immobilizer - Right Knee Immobilizer - Right: Discontinue once straight leg raise with < 10 degree lag Restrictions Weight Bearing Restrictions: No RLE Weight Bearing: Weight bearing as tolerated      Mobility  Bed Mobility Overal bed mobility: Needs Assistance Bed Mobility: Supine to Sit     Supine to sit: Min assist     General bed mobility comments: Assist for R LE.  VCs safety, technique.   Transfers Overall transfer level: Needs assistance Equipment used: Rolling walker (2 wheeled) Transfers: Sit to/from Stand Sit to Stand: Min assist         General transfer comment: Assist to rise, stabilize, control descent. VCs safety, technique, hand placement.   Ambulation/Gait Ambulation/Gait assistance: Min assist Ambulation Distance (Feet): 50 Feet Assistive device: Rolling walker (2 wheeled) Gait Pattern/deviations: Step-to pattern;Decreased stride length;Antalgic     General Gait Details: VCs safety, technique, sequence. Assist to stabilize.   Stairs            Wheelchair Mobility    Modified Rankin (Stroke Patients Only)       Balance                                             Pertinent Vitals/Pain Pain Assessment: 0-10 Pain Score: 5  Pain Location: R knee/thigh Pain Descriptors / Indicators: Aching Pain Intervention(s): Ice  applied;Repositioned;Monitored during session    Home Living Family/patient expects to be discharged to:: Skilled nursing facility Living Arrangements: Alone   Type of Home: House Home Access: Stairs to enter Entrance Stairs-Rails: None Entrance Stairs-Number of Steps: 2 Home Layout: One level Home Equipment: Cane - single point Additional Comments: built-in elevated toilet seat with sink next to it    Prior Function Level of Independence: Independent               Hand Dominance        Extremity/Trunk Assessment   Upper Extremity Assessment: Defer to OT evaluation           Lower Extremity Assessment: RLE deficits/detail RLE Deficits / Details: At least: hip flex 2/5, hip abd/add 2/5, moves ankle well    Cervical / Trunk Assessment: Normal  Communication   Communication: No difficulties  Cognition Arousal/Alertness: Awake/alert Behavior During Therapy: WFL for tasks assessed/performed Overall Cognitive Status: Within Functional Limits for tasks assessed                      General Comments      Exercises Total Joint Exercises Ankle Circles/Pumps: AROM;Both;10 reps;Supine Quad Sets: AROM;Both;10 reps;Supine Heel Slides: AROM;Right;10 reps;Supine Straight Leg Raises: AROM;Right;10 reps;Supine Goniometric ROM: 10-45 degrees      Assessment/Plan    PT Assessment Patient needs continued PT services  PT Diagnosis Difficulty walking;Acute pain   PT  Problem List Decreased strength;Decreased activity tolerance;Decreased mobility;Decreased knowledge of use of DME;Pain;Decreased knowledge of precautions  PT Treatment Interventions DME instruction;Gait training;Functional mobility training;Therapeutic activities;Therapeutic exercise;Patient/family education;Balance training   PT Goals (Current goals can be found in the Care Plan section) Acute Rehab PT Goals Patient Stated Goal: to regain independence PT Goal Formulation: With patient Time For Goal  Achievement: 04/05/14 Potential to Achieve Goals: Good    Frequency 7X/week   Barriers to discharge        Co-evaluation               End of Session Equipment Utilized During Treatment: Gait belt;Right knee immobilizer Activity Tolerance: Patient tolerated treatment well Patient left: in chair;with call bell/phone within reach           Time: 1105-1135 PT Time Calculation (min) (ACUTE ONLY): 30 min   Charges:   PT Evaluation $Initial PT Evaluation Tier I: 1 Procedure PT Treatments $Gait Training: 8-22 mins   PT G Codes:        Weston Anna, MPT Pager: [phone]

## 2014-03-29 NOTE — Progress Notes (Signed)
OT Cancellation Note  Patient Details Name: Laura Lowery MRN: [id number] DOB: April 26, 1928   Cancelled Treatment:    Reason Eval/Treat Not Completed: Other (comment). Noted pt will go to [facility] for rehab. Will defer OT to that venue.  Velia Pamer 03/29/2014, 4:00 PM  Lesle Chris, OTR/L [phone] 03/29/2014

## 2014-03-29 NOTE — Discharge Instructions (Addendum)
Information on my medicine - XARELTO (Rivaroxaban)  This medication education was reviewed with me or my healthcare representative as part of my discharge preparation.  The pharmacist that spoke with me during my hospital stay was:  Hershal Coria, [facility]  Why was Xarelto prescribed for you? Xarelto was prescribed for you to reduce the risk of blood clots forming after orthopedic surgery. The medical term for these abnormal blood clots is venous thromboembolism (VTE).  What do you need to know about xarelto ? Take your Xarelto ONCE DAILY at the same time every day. You may take it either with or without food.  If you have difficulty swallowing the tablet whole, you may crush it and mix in applesauce just prior to taking your dose.  Take Xarelto exactly as prescribed by your doctor and DO NOT stop taking Xarelto without talking to the doctor who prescribed the medication.  Stopping without other VTE prevention medication to take the place of Xarelto may increase your risk of developing a clot.  After discharge, you should have regular check-up appointments with your healthcare provider that is prescribing your Xarelto.    What do you do if you miss a dose? If you miss a dose, take it as soon as you remember on the same day then continue your regularly scheduled once daily regimen the next day. Do not take two doses of Xarelto on the same day.   Important Safety Information A possible side effect of Xarelto is bleeding. You should call your healthcare provider right away if you experience any of the following: ? Bleeding from an injury or your nose that does not stop. ? Unusual colored urine (red or dark brown) or unusual colored stools (red or black). ? Unusual bruising for unknown reasons. ? A serious fall or if you hit your head (even if there is no bleeding).  Some medicines may interact with Xarelto and might increase your risk of bleeding while on Xarelto. To help avoid this,  consult your healthcare provider or pharmacist prior to using any new prescription or non-prescription medications, including herbals, vitamins, non-steroidal anti-inflammatory drugs (NSAIDs) and supplements.  This website has more information on Xarelto: [email or web address]     Dr. Gaynelle Arabian Total Joint Specialist [facility] [address], [place], [place] [address] [phone]  TOTAL KNEE REPLACEMENT POSTOPERATIVE DIRECTIONS    Knee Rehabilitation, Guidelines Following Surgery  Results after knee surgery are often greatly improved when you follow the exercise, range of motion and muscle strengthening exercises prescribed by your doctor. Safety measures are also important to protect the knee from further injury. Any time any of these exercises cause you to have increased pain or swelling in your knee joint, decrease the amount until you are comfortable again and slowly increase them. If you have problems or questions, call your caregiver or physical therapist for advice.   HOME CARE INSTRUCTIONS  Remove items at home which could result in a fall. This includes throw rugs or furniture in walking pathways.  Continue medications as instructed at time of discharge. You may have some home medications which will be placed on hold until you complete the course of blood thinner medication.  You may start showering once you are discharged home but do not submerge the incision under water. Just pat the incision dry and apply a dry gauze dressing on daily. Walk with walker as instructed.  You may resume a sexual relationship in one month or when given the OK by  your  doctor.   Use walker as long as suggested by your caregivers.  Avoid periods of inactivity such as sitting longer than an hour when not asleep. This helps prevent blood clots.  You may put full weight on your legs and walk as much as is comfortable.  You may return to work once you are cleared by your  doctor.  Do not drive a car for 6 weeks or until released by you surgeon.   Do not drive while taking narcotics.  Wear the elastic stockings for three weeks following surgery during the day but you may remove then at night. Make sure you keep all of your appointments after your operation with all of your doctors and caregivers. You should call the office at the above phone number and make an appointment for approximately two weeks after the date of your surgery. Change the dressing daily and reapply a dry dressing each time. Please pick up a stool softener and laxative for home use as long as you are requiring pain medications.  ICE to the affected knee every three hours for 30 minutes at a time and then as needed for pain and swelling.  Continue to use ice on the knee for pain and swelling from surgery. You may notice swelling that will progress down to the foot and ankle.  This is normal after surgery.  Elevate the leg when you are not up walking on it.   It is important for you to complete the blood thinner medication as prescribed by your doctor.  Continue to use the breathing machine which will help keep your temperature down.  It is common for your temperature to cycle up and down following surgery, especially at night when you are not up moving around and exerting yourself.  The breathing machine keeps your lungs expanded and your temperature down.  RANGE OF MOTION AND STRENGTHENING EXERCISES  Rehabilitation of the knee is important following a knee injury or an operation. After just a few days of immobilization, the muscles of the thigh which control the knee become weakened and shrink (atrophy). Knee exercises are designed to build up the tone and strength of the thigh muscles and to improve knee motion. Often times heat used for twenty to thirty minutes before working out will loosen up your tissues and help with improving the range of motion but do not use heat for the first two weeks  following surgery. These exercises can be done on a training (exercise) mat, on the floor, on a table or on a bed. Use what ever works the best and is most comfortable for you Knee exercises include:  Leg Lifts - While your knee is still immobilized in a splint or cast, you can do straight leg raises. Lift the leg to 60 degrees, hold for 3 sec, and slowly lower the leg. Repeat 10-20 times 2-3 times daily. Perform this exercise against resistance later as your knee gets better.  Quad and Hamstring Sets - Tighten up the muscle on the front of the thigh (Quad) and hold for 5-10 sec. Repeat this 10-20 times hourly. Hamstring sets are done by pushing the foot backward against an object and holding for 5-10 sec. Repeat as with quad sets.  A rehabilitation program following serious knee injuries can speed recovery and prevent re-injury in the future due to weakened muscles. Contact your doctor or a physical therapist for more information on knee rehabilitation.   SKILLED REHAB INSTRUCTIONS: If the patient is transferred to a skilled  rehab facility following release from the hospital, a list of the current medications will be sent to the facility for the patient to continue.  When discharged from the skilled rehab facility, please have the facility set up the patient's [place] prior to being released. Also, the skilled facility will be responsible for providing the patient with their medications at time of release from the facility to include their pain medication, the muscle relaxants, and their blood thinner medication. If the patient is still at the rehab facility at time of the two week follow up appointment, the skilled rehab facility will also need to assist the patient in arranging follow up appointment in our office and any transportation needs.  MAKE SURE YOU:  Understand these instructions.  Will watch your condition.  Will get help right away if you are not doing well or get worse.     Pick up stool softner and laxative for home use following surgery while on pain medications. Do not submerge incision under water. Please use good hand washing techniques while changing dressing each day. May shower starting three days after surgery. Please use a clean towel to pat the incision dry following showers. Continue to use ice for pain and swelling after surgery. Do not use any lotions or creams on the incision until instructed by your surgeon.  Take Xarelto for two and a half more weeks, then discontinue Xarelto. Once the patient has completed the blood thinner regimen, then take a Baby 81 mg Aspirin daily for three more weeks.  Postoperative Constipation Protocol  Constipation - defined medically as fewer than three stools per week and severe constipation as less than one stool per week.  One of the most common issues patients have following surgery is constipation.  Even if you have a regular bowel pattern at home, your normal regimen is likely to be disrupted due to multiple reasons following surgery.  Combination of anesthesia, postoperative narcotics, change in appetite and fluid intake all can affect your bowels.  In order to avoid complications following surgery, here are some recommendations in order to help you during your recovery period.  Colace (docusate) - Pick up an over-the-counter form of Colace or another stool softener and take twice a day as long as you are requiring postoperative pain medications.  Take with a full glass of water daily.  If you experience loose stools or diarrhea, hold the colace until you stool forms back up.  If your symptoms do not get better within 1 week or if they get worse, check with your doctor.  Dulcolax (bisacodyl) - Pick up over-the-counter and take as directed by the product packaging as needed to assist with the movement of your bowels.  Take with a full glass of water.  Use this product as needed if not relieved by Colace only.     MiraLax (polyethylene glycol) - Pick up over-the-counter to have on hand.  MiraLax is a solution that will increase the amount of water in your bowels to assist with bowel movements.  Take as directed and can mix with a glass of water, juice, soda, coffee, or tea.  Take if you go more than two days without a movement. Do not use MiraLax more than once per day. Call your doctor if you are still constipated or irregular after using this medication for 7 days in a row.  If you continue to have problems with postoperative constipation, please contact the office for further assistance and recommendations.  If you experience "the worst abdominal pain ever" or develop nausea or vomiting, please contact the office immediatly for further recommendations for treatment.  When discharged from the skilled rehab facility, please have the facility set up the patient's [place] prior to being released. Please make sure this gets set up prior to release in order to avoid any lapse of therapy following the rehab stay.  Also provide the patient with their medications at time of release from the facility to include their pain medication, the muscle relaxants, and their blood thinner medication.  If the patient is still at the rehab facility at time of follow up appointment, please also assist the patient in arranging follow up appointment in our office and any transportation needs. ICE to the affected knee or hip every three hours for 30 minutes at a time and then as needed for pain and swelling.

## 2014-03-29 NOTE — Progress Notes (Signed)
Clinical Social Work Department CLINICAL SOCIAL WORK PLACEMENT NOTE 03/29/2014  Patient:  Laura Lowery, Laura Lowery  Account Number:  [id number] Admit date:  03/28/2014  Clinical Social Worker:  Werner Lean, LCSW  Date/time:  03/29/2014 02:28 PM  Clinical Social Work is seeking post-discharge placement for this patient at the following level of care:   SKILLED NURSING   (*CSW will update this form in Epic as items are completed)     Patient/family provided with [place] Department of Clinical Social Work's list of facilities offering this level of care within the geographic area requested by the patient (or if unable, by the patient's family).  03/29/2014  Patient/family informed of their freedom to choose among providers that offer the needed level of care, that participate in Medicare, Medicaid or managed care program needed by the patient, have an available bed and are willing to accept the patient.    Patient/family informed of [facility]' ownership interest in [facility], as well as of the fact that they are under no obligation to receive care at this facility.  PASARR submitted to EDS on 03/29/2014 PASARR number received on 03/29/2014  FL2 transmitted to all facilities in geographic area requested by pt/family on  03/29/2014 FL2 transmitted to all facilities within larger geographic area on   Patient informed that his/her managed care company has contracts with or will negotiate with  certain facilities, including the following:     Patient/family informed of bed offers received:  03/29/2014 Patient chooses bed at [place] Physician recommends and patient chooses bed at    Patient to be transferred to  on   Patient to be transferred to facility by  Patient and family notified of transfer on  Name of family member notified:    The following physician request were entered in Epic:   Additional Comments:  Werner Lean LCSW [phone]

## 2014-03-29 NOTE — Care Management Note (Signed)
    Page 1 of 1   03/29/2014     3:45:46 PM CARE MANAGEMENT NOTE 03/29/2014  Patient:  Laura Lowery, Laura Lowery   Account Number:  [id number]  Date Initiated:  03/29/2014  Documentation initiated by:  [facility]  Subjective/Objective Assessment:   adm: RIGHT TOTAL KNEE ARTHROPLASTY (Right)     Action/Plan:   discharge planning   Anticipated DC Date:  03/30/2014   Anticipated DC Plan:  [place]  CM consult      Choice offered to / List presented to:             Status of service:  Completed, signed off Medicare Important Message given?   (If response is "NO", the following Medicare IM given date fields will be blank) Date Medicare IM given:   Medicare IM given by:   Date Additional Medicare IM given:   Additional Medicare IM given by:    Discharge Disposition:  [place]  Per UR Regulation:    If discussed at Long Length of Stay Meetings, dates discussed:    Comments:  03/29/14 15:40 CM notes pt to go to SNF for rehab; CSW arranging.  No other CM needs were communicated.  Mariane Masters, BSN, CM [phone].

## 2014-03-29 NOTE — Progress Notes (Signed)
Clinical Social Work Department BRIEF PSYCHOSOCIAL ASSESSMENT 03/29/2014  Patient:  Laura Lowery, Laura Lowery     Account Number:  [id number]     Admit date:  03/28/2014  Clinical Social Worker:  Lacie Scotts  Date/Time:  03/29/2014 02:22 PM  Referred by:  Physician  Date Referred:  03/29/2014 Referred for  SNF Placement   Other Referral:   Interview type:  Patient Other interview type:    PSYCHOSOCIAL DATA Living Status:  ALONE Admitted from facility:   Level of care:   Primary support name:  Jerolyn Shin Primary support relationship to patient:  CHILD, ADULT Degree of support available:   supportive    CURRENT CONCERNS Current Concerns  Post-Acute Placement   Other Concerns:    SOCIAL WORK ASSESSMENT / PLAN Pt is an 81 yr old female living at home prior to hospitalization. CSW met with pt to assist with d/c planning. This is a planned admission. Pt has made prior arrangements to have ST Rehab at [facility] following hospital d/c. CSW has contacted SNF and d/c plans have been confirmed. CSW will continue to follow to assist with d/c planning to SNF.   Assessment/plan status:  Psychosocial Support/Ongoing Assessment of Needs Other assessment/ plan:   Information/referral to community resources:   Insurance coverage for SNF and ambulance transport reviewed.    PATIENT'S/FAMILY'S RESPONSE TO PLAN OF CARE: " I know recovery will be painful but things will get better. " Pt is motivated to work with therapy. " I'm looking forward to having rehab at [facility]. "    Werner Lean LCSW [phone]

## 2014-03-30 LAB — BASIC METABOLIC PANEL
Anion gap: 9 (ref 5–15)
BUN: 19 mg/dL (ref 6–23)
CALCIUM: 9 mg/dL (ref 8.4–10.5)
CO2: 27 mmol/L (ref 19–32)
CREATININE: 0.84 mg/dL (ref 0.50–1.10)
Chloride: 104 mmol/L (ref 96–112)
GFR calc Af Amer: 72 mL/min — ABNORMAL LOW (ref >90)
GFR calc non Af Amer: 63 mL/min — ABNORMAL LOW (ref >90)
GLUCOSE: 192 mg/dL — AB (ref 70–99)
Potassium: 4.5 mmol/L (ref 3.5–5.1)
Sodium: 140 mmol/L (ref 135–145)

## 2014-03-30 LAB — CBC
HEMATOCRIT: 29.3 % — AB (ref 36.0–46.0)
Hemoglobin: 9.8 g/dL — ABNORMAL LOW (ref 12.0–15.0)
MCH: 30.3 pg (ref 26.0–34.0)
MCHC: 33.4 g/dL (ref 30.0–36.0)
MCV: 90.7 fL (ref 78.0–100.0)
Platelets: 224 10*3/uL (ref 150–400)
RBC: 3.23 MIL/uL — AB (ref 3.87–5.11)
RDW: 12.9 % (ref 11.5–15.5)
WBC: 12 10*3/uL — ABNORMAL HIGH (ref 4.0–10.5)

## 2014-03-30 LAB — GLUCOSE, CAPILLARY
Glucose-Capillary: 131 mg/dL — ABNORMAL HIGH (ref 70–99)
Glucose-Capillary: 100 mg/dL — ABNORMAL HIGH (ref 70–99)
Glucose-Capillary: 214 mg/dL — ABNORMAL HIGH (ref 70–99)
Glucose-Capillary: 122 mg/dL — ABNORMAL HIGH (ref 70–99)

## 2014-03-30 NOTE — Progress Notes (Addendum)
   Clinical Social Work Department CLINICAL SOCIAL WORK PLACEMENT NOTE 03/30/2014  Patient:  Laura Lowery, Laura Lowery  Account Number:  [id number] Admit date:  03/28/2014  Clinical Social Worker:  Werner Lean, LCSW  Date/time:  03/29/2014 02:28 PM  Clinical Social Work is seeking post-discharge placement for this patient at the following level of care:   SKILLED NURSING   (*CSW will update this form in Epic as items are completed)     Patient/family provided with [place] Department of Clinical Social Work's list of facilities offering this level of care within the geographic area requested by the patient (or if unable, by the patient's family).  03/29/2014  Patient/family informed of their freedom to choose among providers that offer the needed level of care, that participate in Medicare, Medicaid or managed care program needed by the patient, have an available bed and are willing to accept the patient.    Patient/family informed of [facility]' ownership interest in [facility], as well as of the fact that they are under no obligation to receive care at this facility.  PASARR submitted to EDS on 03/29/2014 PASARR number received on 03/29/2014  FL2 transmitted to all facilities in geographic area requested by pt/family on  03/29/2014 FL2 transmitted to all facilities within larger geographic area on   Patient informed that his/her managed care company has contracts with or will negotiate with  certain facilities, including the following:     Patient/family informed of bed offers received:  03/29/2014 Patient chooses bed at [place] Physician recommends and patient chooses bed at    Patient to be transferred to [place] on   Patient to be transferred to facility by  Patient and family notified of transfer on Name of family member notified:   The following physician request were entered in Epic:   Additional Comments:  Werner Lean LCSW [phone]

## 2014-03-30 NOTE — Progress Notes (Signed)
Physical Therapy Treatment Patient Details Name: Laura Lowery MRN: [id number] DOB: 1928/12/17 Today's Date: 03/30/2014    History of Present Illness 81 yo female s/p R TKA 03/28/14. Hx of DM, peripheral neuropathy.     PT Comments    Progressing with mobility. Plan is for d/c to SNF today.   Follow Up Recommendations  SNF     Equipment Recommendations  Rolling walker with 5" wheels    Recommendations for Other Services       Precautions / Restrictions Precautions Precautions: Knee Required Braces or Orthoses: Knee Immobilizer - Right Knee Immobilizer - Right: Discontinue once straight leg raise with < 10 degree lag Restrictions Weight Bearing Restrictions: No RLE Weight Bearing: Weight bearing as tolerated    Mobility  Bed Mobility Overal bed mobility: Needs Assistance Bed Mobility: Supine to Sit;Sit to Supine     Supine to sit: Min assist Sit to supine: Min assist   General bed mobility comments: Assist for R LE.  VCs safety  Transfers Overall transfer level: Needs assistance Equipment used: Rolling walker (2 wheeled) Transfers: Sit to/from Stand Sit to Stand: Min assist         General transfer comment: Assist to control descent. VCs safety, technique, hand placement.   Ambulation/Gait Ambulation/Gait assistance: Min assist Ambulation Distance (Feet): 120 Feet Assistive device: Rolling walker (2 wheeled) Gait Pattern/deviations: Step-to pattern;Decreased stride length;Antalgic     General Gait Details: VCs safety, technique, sequence. close guard for safety   Stairs            Wheelchair Mobility    Modified Rankin (Stroke Patients Only)       Balance                                    Cognition Arousal/Alertness: Awake/alert Behavior During Therapy: WFL for tasks assessed/performed Overall Cognitive Status: Within Functional Limits for tasks assessed                      Exercises Total Joint  Exercises Ankle Circles/Pumps: AROM;Both;10 reps;Supine Quad Sets: AROM;Both;10 reps;Supine Heel Slides: AROM;Right;10 reps;Supine Straight Leg Raises: AROM;Right;10 reps;Supine Goniometric ROM: 10-50 degrees    General Comments        Pertinent Vitals/Pain Pain Assessment: 0-10 Pain Score: 9  Pain Location: R knee/thigh Pain Descriptors / Indicators: Aching;Sore Pain Intervention(s): Monitored during session;Ice applied;Repositioned    Home Living                      Prior Function            PT Goals (current goals can now be found in the care plan section) Progress towards PT goals: Progressing toward goals    Frequency  7X/week    PT Plan Current plan remains appropriate    Co-evaluation             End of Session Equipment Utilized During Treatment: Gait belt;Right knee immobilizer Activity Tolerance: Patient tolerated treatment well Patient left: in bed;with call bell/phone within reach     Time: 1012-1049 PT Time Calculation (min) (ACUTE ONLY): 37 min  Charges:  $Gait Training: 8-22 mins $Therapeutic Exercise: 8-22 mins                    G Codes:      Weston Anna, MPT Pager: [phone]

## 2014-03-30 NOTE — Progress Notes (Signed)
   Subjective: 2 Days Post-Op Procedure(s) (LRB): RIGHT TOTAL KNEE ARTHROPLASTY (Right) Patient reports pain as mild.   Patient seen in rounds with Dr. Wynelle Link. Family in room. Patient is well, but has had some minor complaints of pain in the knee, requiring pain medications Patient is ready to go to [facility]  Objective: Vital signs in last 24 hours: Temp:  [97.6 F (36.4 C)-98.2 F (36.8 C)] 98 F (36.7 C) (03/23 0523) Pulse Rate:  [70-78] 78 (03/23 0523) Resp:  [16-18] 17 (03/23 0523) BP: (128-189)/(46-70) 189/70 mmHg (03/23 0523) SpO2:  [94 %-97 %] 96 % (03/23 0523)  Intake/Output from previous day:  Intake/Output Summary (Last 24 hours) at 03/30/14 0916 Last data filed at 03/30/14 0500  Gross per 24 hour  Intake      0 ml  Output   2650 ml  Net  -2650 ml     Labs:  Recent Labs  03/29/14 0604 03/30/14 0510  HGB 10.7* 9.8*    Recent Labs  03/29/14 0604 03/30/14 0510  WBC 12.9* 12.0*  RBC 3.56* 3.23*  HCT 32.1* 29.3*  PLT 270 224    Recent Labs  03/29/14 0604 03/30/14 0510  NA 137 140  K 4.5 4.5  CL 103 104  CO2 25 27  BUN 17 19  CREATININE 0.89 0.84  GLUCOSE 189* 192*  CALCIUM 8.6 9.0   No results for input(s): LABPT, INR in the last 72 hours.  EXAM: General - Patient is Alert, Appropriate and Oriented Extremity - Neurovascular intact Sensation intact distally Dorsiflexion/Plantar flexion intact Incision - clean, dry, no drainage Motor Function - intact, moving foot and toes well on exam.   Assessment/Plan: 2 Days Post-Op Procedure(s) (LRB): RIGHT TOTAL KNEE ARTHROPLASTY (Right) Procedure(s) (LRB): RIGHT TOTAL KNEE ARTHROPLASTY (Right) Past Medical History  Diagnosis Date  . Hyperlipidemia   . DJD (degenerative joint disease)     c spine  . Osteoarthritis   . Cataracts, bilateral   . Hypertension   . Vitamin D deficiency   . Vitamin B 12 deficiency   . Diabetes   . Neuromuscular disorder     neuropathy in feet   .  Tuberculosis     hx of positive TB since age 44   . Stress incontinence    Principal Problem:   OA (osteoarthritis) of knee  Estimated body mass index is 28.31 kg/(m^2) as calculated from the following:   Height as of this encounter: 5\' 4"  (1.626 m).   Weight as of this encounter: 74.844 kg (165 lb). Up with therapy Discharge to SNF Diet - Cardiac diet Follow up - next Tuesday 3/29 Activity - WBAT Disposition - [facility] Condition Upon Discharge - Good D/C Meds - See DC Summary DVT Prophylaxis - Xarelto  Arlee Muslim, PA-C Orthopaedic Surgery 03/30/2014, 9:16 AM

## 2014-03-30 NOTE — Progress Notes (Signed)
[facility] contacted by csw. SNF is unable to accept d/c summary past 4 pm today. MD/PA will be in surgery well past 4 pm. SNF will accept pt in the am. Pt / MD / PA / NSG aware. NSG to update pt's daughter per pt's request.  Werner Lean LCSW [phone]

## 2014-03-31 DIAGNOSIS — R531 Weakness: Secondary | ICD-10-CM | POA: Diagnosis not present

## 2014-03-31 DIAGNOSIS — R2681 Unsteadiness on feet: Secondary | ICD-10-CM | POA: Diagnosis not present

## 2014-03-31 DIAGNOSIS — K5900 Constipation, unspecified: Secondary | ICD-10-CM | POA: Diagnosis not present

## 2014-03-31 DIAGNOSIS — R278 Other lack of coordination: Secondary | ICD-10-CM | POA: Diagnosis not present

## 2014-03-31 DIAGNOSIS — I10 Essential (primary) hypertension: Secondary | ICD-10-CM | POA: Diagnosis not present

## 2014-03-31 DIAGNOSIS — E119 Type 2 diabetes mellitus without complications: Secondary | ICD-10-CM | POA: Diagnosis not present

## 2014-03-31 DIAGNOSIS — Z471 Aftercare following joint replacement surgery: Secondary | ICD-10-CM | POA: Diagnosis not present

## 2014-03-31 DIAGNOSIS — D62 Acute posthemorrhagic anemia: Secondary | ICD-10-CM | POA: Diagnosis not present

## 2014-03-31 DIAGNOSIS — M2580 Other specified joint disorders, unspecified joint: Secondary | ICD-10-CM | POA: Diagnosis not present

## 2014-03-31 DIAGNOSIS — M25561 Pain in right knee: Secondary | ICD-10-CM | POA: Diagnosis not present

## 2014-03-31 DIAGNOSIS — Z96651 Presence of right artificial knee joint: Secondary | ICD-10-CM | POA: Diagnosis not present

## 2014-03-31 DIAGNOSIS — M1711 Unilateral primary osteoarthritis, right knee: Secondary | ICD-10-CM | POA: Diagnosis not present

## 2014-03-31 DIAGNOSIS — E785 Hyperlipidemia, unspecified: Secondary | ICD-10-CM | POA: Diagnosis not present

## 2014-03-31 LAB — CBC
HCT: 29.8 % — ABNORMAL LOW (ref 36.0–46.0)
Hemoglobin: 10 g/dL — ABNORMAL LOW (ref 12.0–15.0)
MCH: 30.6 pg (ref 26.0–34.0)
MCHC: 33.6 g/dL (ref 30.0–36.0)
MCV: 91.1 fL (ref 78.0–100.0)
PLATELETS: 249 10*3/uL (ref 150–400)
RBC: 3.27 MIL/uL — AB (ref 3.87–5.11)
RDW: 13 % (ref 11.5–15.5)
WBC: 10.2 10*3/uL (ref 4.0–10.5)

## 2014-03-31 LAB — GLUCOSE, CAPILLARY
Glucose-Capillary: 84 mg/dL (ref 70–99)
Glucose-Capillary: 154 mg/dL — ABNORMAL HIGH (ref 70–99)

## 2014-03-31 MED ORDER — DOCUSATE SODIUM 100 MG PO CAPS: 100.0000 mg | ORAL_CAPSULE | Freq: Two times a day (BID) | ORAL | Status: AC

## 2014-03-31 MED ORDER — BISACODYL 10 MG RE SUPP
10.0000 mg | Freq: Once | RECTAL | Status: AC
  Administered 2014-03-31: 10 mg via RECTAL

## 2014-03-31 MED ORDER — TRAMADOL HCL 50 MG PO TABS: 50.0000 mg | ORAL_TABLET | Freq: Four times a day (QID) | ORAL | Status: AC | PRN

## 2014-03-31 MED ORDER — OXYCODONE HCL 5 MG PO TABS: 5.0000 mg | ORAL_TABLET | ORAL | Status: AC | PRN

## 2014-03-31 MED ORDER — POLYETHYLENE GLYCOL 3350 17 G PO PACK: 17.0000 g | PACK | Freq: Every day | ORAL | Status: AC | PRN

## 2014-03-31 MED ORDER — RIVAROXABAN 10 MG PO TABS: 10.0000 mg | ORAL_TABLET | Freq: Every day | ORAL | Status: AC

## 2014-03-31 MED ORDER — BISACODYL 10 MG RE SUPP: 10.0000 mg | Freq: Every day | RECTAL | Status: AC | PRN

## 2014-03-31 MED ORDER — ACETAMINOPHEN 325 MG PO TABS: 650.0000 mg | ORAL_TABLET | Freq: Four times a day (QID) | ORAL | Status: AC | PRN

## 2014-03-31 MED ORDER — METOCLOPRAMIDE HCL 5 MG PO TABS: 5.0000 mg | ORAL_TABLET | Freq: Three times a day (TID) | ORAL | Status: AC | PRN

## 2014-03-31 MED ORDER — METHOCARBAMOL 500 MG PO TABS: 500.0000 mg | ORAL_TABLET | Freq: Four times a day (QID) | ORAL | Status: AC | PRN

## 2014-03-31 MED ORDER — ONDANSETRON HCL 4 MG PO TABS: 4.0000 mg | ORAL_TABLET | Freq: Four times a day (QID) | ORAL | Status: AC | PRN

## 2014-03-31 NOTE — Progress Notes (Signed)
   Subjective: 3 Days Post-Op Procedure(s) (LRB): RIGHT TOTAL KNEE ARTHROPLASTY (Right) Patient reports pain as mild.   Patient seen in rounds by Dr. Wynelle Link. Patient is well, but has had some minor complaints of pain in the knee, requiring pain medications Patient is ready to go to [facility].  Objective: Vital signs in last 24 hours: Temp:  [98.4 F (36.9 C)-98.5 F (36.9 C)] 98.5 F (36.9 C) (03/24 0521) Pulse Rate:  [76-83] 76 (03/24 0521) Resp:  [18] 18 (03/24 0521) BP: (128-156)/(50-77) 128/50 mmHg (03/24 0521) SpO2:  [96 %-99 %] 96 % (03/24 0521)  Intake/Output from previous day:  Intake/Output Summary (Last 24 hours) at 03/31/14 0803 Last data filed at 03/31/14 0000  Gross per 24 hour  Intake    840 ml  Output   1500 ml  Net   -660 ml    Labs:  Recent Labs  03/29/14 0604 03/30/14 0510 03/31/14 0510  HGB 10.7* 9.8* 10.0*    Recent Labs  03/30/14 0510 03/31/14 0510  WBC 12.0* 10.2  RBC 3.23* 3.27*  HCT 29.3* 29.8*  PLT 224 249    Recent Labs  03/29/14 0604 03/30/14 0510  NA 137 140  K 4.5 4.5  CL 103 104  CO2 25 27  BUN 17 19  CREATININE 0.89 0.84  GLUCOSE 189* 192*  CALCIUM 8.6 9.0   No results for input(s): LABPT, INR in the last 72 hours.  EXAM: General - Patient is Alert, Appropriate and Oriented Extremity - Neurovascular intact Sensation intact distally Dorsiflexion/Plantar flexion intact Incision - clean, dry, no drainage, healing Motor Function - intact, moving foot and toes well on exam.   Assessment/Plan: 3 Days Post-Op Procedure(s) (LRB): RIGHT TOTAL KNEE ARTHROPLASTY (Right) Procedure(s) (LRB): RIGHT TOTAL KNEE ARTHROPLASTY (Right) Past Medical History  Diagnosis Date  . Hyperlipidemia   . DJD (degenerative joint disease)     c spine  . Osteoarthritis   . Cataracts, bilateral   . Hypertension   . Vitamin D deficiency   . Vitamin B 12 deficiency   . Diabetes   . Neuromuscular disorder     neuropathy in feet     . Tuberculosis     hx of positive TB since age 77   . Stress incontinence    Principal Problem:   OA (osteoarthritis) of knee  Estimated body mass index is 28.31 kg/(m^2) as calculated from the following:   Height as of this encounter: 5\' 4"  (1.626 m).   Weight as of this encounter: 74.844 kg (165 lb). Up with therapy Discharge to SNF Diet - Cardiac diet and Diabetic diet Follow up - in 2 weeks Activity - WBAT Disposition - Skilled nursing facility Condition Upon Discharge - Good D/C Meds - See DC Summary DVT Prophylaxis - Xarelto  Arlee Muslim, PA-C Orthopaedic Surgery 03/31/2014, 8:03 AM

## 2014-03-31 NOTE — Discharge Summary (Signed)
Physician Discharge Summary   Patient ID: Laura Lowery MRN: [id number] DOB/AGE: 04-16-1928 81 y.o.  Admit date: 03/28/2014 Discharge date: 03-31-2014  Primary Diagnosis:  Osteoarthritis Right knee(s)  Admission Diagnoses:  Past Medical History  Diagnosis Date  . Hyperlipidemia   . DJD (degenerative joint disease)     c spine  . Osteoarthritis   . Cataracts, bilateral   . Hypertension   . Vitamin D deficiency   . Vitamin B 12 deficiency   . Diabetes   . Neuromuscular disorder     neuropathy in feet   . Tuberculosis     hx of positive TB since age 65   . Stress incontinence    Discharge Diagnoses:   Principal Problem:   OA (osteoarthritis) of knee  Estimated body mass index is 28.31 kg/(m^2) as calculated from the following:   Height as of this encounter: 5' 4"  (1.626 m).   Weight as of this encounter: 74.844 kg (165 lb).  Procedure:  Procedure(s) (LRB): RIGHT TOTAL KNEE ARTHROPLASTY (Right)   Consults: None  HPI: Laura Lowery is a 81 y.o. year old female with end stage OA of her right knee with progressively worsening pain and dysfunction. She has constant pain, with activity and at rest and significant functional deficits with difficulties even with ADLs. She has had extensive non-op management including analgesics, injections of cortisone and viscosupplements, and home exercise program, but remains in significant pain with significant dysfunction.Radiographs show bone on bone arthritis lateral and patellofemoral with large valgus deformity. She presents now for right Total Knee Arthroplasty.   Laboratory Data: Admission on 03/28/2014  Component Date Value Ref Range Status  . ABO/RH(D) 03/28/2014 B POS   Final  . Antibody Screen 03/28/2014 NEG   Final  . Sample Expiration 03/28/2014 03/31/2014   Final  . ABO/RH(D) 03/28/2014 B POS   Final  . Glucose-Capillary 03/28/2014 93  70 - 99 mg/dL Final  . Comment 1 03/28/2014 Document in Chart   Final  . Comment 2  03/28/2014 Call MD NNP PA CNM   Final  . Glucose-Capillary 03/28/2014 105* 70 - 99 mg/dL Final  . Glucose-Capillary 03/28/2014 109* 70 - 99 mg/dL Final  . Comment 1 03/28/2014 Notify RN   Final  . Comment 2 03/28/2014 Document in Chart   Final  . Glucose-Capillary 03/28/2014 260* 70 - 99 mg/dL Final  . WBC 03/29/2014 12.9* 4.0 - 10.5 K/uL Final  . RBC 03/29/2014 3.56* 3.87 - 5.11 MIL/uL Final  . Hemoglobin 03/29/2014 10.7* 12.0 - 15.0 g/dL Final  . HCT 03/29/2014 32.1* 36.0 - 46.0 % Final  . MCV 03/29/2014 90.2  78.0 - 100.0 fL Final  . MCH 03/29/2014 30.1  26.0 - 34.0 pg Final  . MCHC 03/29/2014 33.3  30.0 - 36.0 g/dL Final  . RDW 03/29/2014 12.8  11.5 - 15.5 % Final  . Platelets 03/29/2014 270  150 - 400 K/uL Final  . Sodium 03/29/2014 137  135 - 145 mmol/L Final  . Potassium 03/29/2014 4.5  3.5 - 5.1 mmol/L Final  . Chloride 03/29/2014 103  96 - 112 mmol/L Final  . CO2 03/29/2014 25  19 - 32 mmol/L Final  . Glucose, Bld 03/29/2014 189* 70 - 99 mg/dL Final  . BUN 03/29/2014 17  6 - 23 mg/dL Final  . Creatinine, Ser 03/29/2014 0.89  0.50 - 1.10 mg/dL Final  . Calcium 03/29/2014 8.6  8.4 - 10.5 mg/dL Final  . GFR calc non Af Amer 03/29/2014 58* >  90 mL/min Final  . GFR calc Af Amer 03/29/2014 68* >90 mL/min Final   Comment: (NOTE) The eGFR has been calculated using the CKD EPI equation. This calculation has not been validated in all clinical situations. eGFR's persistently <90 mL/min signify possible Chronic Kidney Disease.   . Anion gap 03/29/2014 9  5 - 15 Final  . Glucose-Capillary 03/28/2014 210* 70 - 99 mg/dL Final  . Comment 1 03/28/2014 Notify RN   Final  . Comment 2 03/28/2014 Document in Chart   Final  . Glucose-Capillary 03/29/2014 160* 70 - 99 mg/dL Final  . Glucose-Capillary 03/29/2014 200* 70 - 99 mg/dL Final  . WBC 03/30/2014 12.0* 4.0 - 10.5 K/uL Final  . RBC 03/30/2014 3.23* 3.87 - 5.11 MIL/uL Final  . Hemoglobin 03/30/2014 9.8* 12.0 - 15.0 g/dL Final  . HCT  03/30/2014 29.3* 36.0 - 46.0 % Final  . MCV 03/30/2014 90.7  78.0 - 100.0 fL Final  . MCH 03/30/2014 30.3  26.0 - 34.0 pg Final  . MCHC 03/30/2014 33.4  30.0 - 36.0 g/dL Final  . RDW 03/30/2014 12.9  11.5 - 15.5 % Final  . Platelets 03/30/2014 224  150 - 400 K/uL Final  . Sodium 03/30/2014 140  135 - 145 mmol/L Final  . Potassium 03/30/2014 4.5  3.5 - 5.1 mmol/L Final  . Chloride 03/30/2014 104  96 - 112 mmol/L Final  . CO2 03/30/2014 27  19 - 32 mmol/L Final  . Glucose, Bld 03/30/2014 192* 70 - 99 mg/dL Final  . BUN 03/30/2014 19  6 - 23 mg/dL Final  . Creatinine, Ser 03/30/2014 0.84  0.50 - 1.10 mg/dL Final  . Calcium 03/30/2014 9.0  8.4 - 10.5 mg/dL Final  . GFR calc non Af Amer 03/30/2014 63* >90 mL/min Final  . GFR calc Af Amer 03/30/2014 72* >90 mL/min Final   Comment: (NOTE) The eGFR has been calculated using the CKD EPI equation. This calculation has not been validated in all clinical situations. eGFR's persistently <90 mL/min signify possible Chronic Kidney Disease.   . Anion gap 03/30/2014 9  5 - 15 Final  . Glucose-Capillary 03/29/2014 289* 70 - 99 mg/dL Final  . Glucose-Capillary 03/29/2014 266* 70 - 99 mg/dL Final  . Comment 1 03/29/2014 Notify RN   Final  . Glucose-Capillary 03/30/2014 131* 70 - 99 mg/dL Final  . Glucose-Capillary 03/30/2014 100* 70 - 99 mg/dL Final  . WBC 03/31/2014 10.2  4.0 - 10.5 K/uL Final  . RBC 03/31/2014 3.27* 3.87 - 5.11 MIL/uL Final  . Hemoglobin 03/31/2014 10.0* 12.0 - 15.0 g/dL Final  . HCT 03/31/2014 29.8* 36.0 - 46.0 % Final  . MCV 03/31/2014 91.1  78.0 - 100.0 fL Final  . MCH 03/31/2014 30.6  26.0 - 34.0 pg Final  . MCHC 03/31/2014 33.6  30.0 - 36.0 g/dL Final  . RDW 03/31/2014 13.0  11.5 - 15.5 % Final  . Platelets 03/31/2014 249  150 - 400 K/uL Final  . Glucose-Capillary 03/30/2014 214* 70 - 99 mg/dL Final  . Glucose-Capillary 03/30/2014 122* 70 - 99 mg/dL Final  . Comment 1 03/30/2014 Notify RN   Final  . Glucose-Capillary  03/31/2014 84  70 - 99 mg/dL Final  Hospital Outpatient Visit on 03/22/2014  Component Date Value Ref Range Status  . MRSA, PCR 03/22/2014 NEGATIVE  NEGATIVE Final  . Staphylococcus aureus 03/22/2014 POSITIVE* NEGATIVE Final   Comment:        The Xpert SA Assay (FDA approved for NASAL specimens  in patients over 56 years of age), is one component of a comprehensive surveillance program.  Test performance has been validated by [facility] for patients greater than or equal to 63 year old. It is not intended to diagnose infection nor to guide or monitor treatment.   Marland Kitchen aPTT 03/22/2014 30  24 - 37 seconds Final  . WBC 03/22/2014 6.8  4.0 - 10.5 K/uL Final  . RBC 03/22/2014 4.31  3.87 - 5.11 MIL/uL Final  . Hemoglobin 03/22/2014 12.9  12.0 - 15.0 g/dL Final  . HCT 03/22/2014 39.0  36.0 - 46.0 % Final  . MCV 03/22/2014 90.5  78.0 - 100.0 fL Final  . MCH 03/22/2014 29.9  26.0 - 34.0 pg Final  . MCHC 03/22/2014 33.1  30.0 - 36.0 g/dL Final  . RDW 03/22/2014 12.5  11.5 - 15.5 % Final  . Platelets 03/22/2014 321  150 - 400 K/uL Final  . Sodium 03/22/2014 139  135 - 145 mmol/L Final  . Potassium 03/22/2014 4.0  3.5 - 5.1 mmol/L Final  . Chloride 03/22/2014 106  96 - 112 mmol/L Final  . CO2 03/22/2014 28  19 - 32 mmol/L Final  . Glucose, Bld 03/22/2014 144* 70 - 99 mg/dL Final  . BUN 03/22/2014 15  6 - 23 mg/dL Final  . Creatinine, Ser 03/22/2014 0.82  0.50 - 1.10 mg/dL Final  . Calcium 03/22/2014 9.5  8.4 - 10.5 mg/dL Final  . Total Protein 03/22/2014 7.1  6.0 - 8.3 g/dL Final  . Albumin 03/22/2014 4.2  3.5 - 5.2 g/dL Final  . AST 03/22/2014 20  0 - 37 U/L Final  . ALT 03/22/2014 16  0 - 35 U/L Final  . Alkaline Phosphatase 03/22/2014 53  39 - 117 U/L Final  . Total Bilirubin 03/22/2014 0.3  0.3 - 1.2 mg/dL Final  . GFR calc non Af Amer 03/22/2014 64* >90 mL/min Final  . GFR calc Af Amer 03/22/2014 75* >90 mL/min Final   Comment: (NOTE) The eGFR has been calculated using the CKD EPI  equation. This calculation has not been validated in all clinical situations. eGFR's persistently <90 mL/min signify possible Chronic Kidney Disease.   . Anion gap 03/22/2014 5  5 - 15 Final  . Prothrombin Time 03/22/2014 13.7  11.6 - 15.2 seconds Final  . INR 03/22/2014 1.04  0.00 - 1.49 Final  . Color, Urine 03/22/2014 YELLOW  YELLOW Final  . APPearance 03/22/2014 CLEAR  CLEAR Final  . Specific Gravity, Urine 03/22/2014 1.010  1.005 - 1.030 Final  . pH 03/22/2014 5.5  5.0 - 8.0 Final  . Glucose, UA 03/22/2014 NEGATIVE  NEGATIVE mg/dL Final  . Hgb urine dipstick 03/22/2014 NEGATIVE  NEGATIVE Final  . Bilirubin Urine 03/22/2014 NEGATIVE  NEGATIVE Final  . Ketones, ur 03/22/2014 NEGATIVE  NEGATIVE mg/dL Final  . Protein, ur 03/22/2014 NEGATIVE  NEGATIVE mg/dL Final  . Urobilinogen, UA 03/22/2014 0.2  0.0 - 1.0 mg/dL Final  . Nitrite 03/22/2014 NEGATIVE  NEGATIVE Final  . Leukocytes, UA 03/22/2014 NEGATIVE  NEGATIVE Final   MICROSCOPIC NOT DONE ON URINES WITH NEGATIVE PROTEIN, BLOOD, LEUKOCYTES, NITRITE, OR GLUCOSE <1000 mg/dL.     X-Rays:No results found.  EKG: Orders placed or performed during the hospital encounter of 03/22/14  . EKG 12-Lead  . EKG 12-Lead     Hospital Course: Laura Lowery is a 81 y.o. who was admitted to [facility]. They were brought to the operating room on 03/28/2014 and underwent Procedure(s):  RIGHT TOTAL KNEE ARTHROPLASTY.  Patient tolerated the procedure well and was later transferred to the recovery room and then to the orthopaedic floor for postoperative care.  They were given PO and IV analgesics for pain control following their surgery.  They were given 24 hours of postoperative antibiotics of  Anti-infectives    Start     Dose/Rate Route Frequency Ordered Stop   03/28/14 1600  ceFAZolin (ANCEF) IVPB 2 g/50 mL premix     2 g 100 mL/hr over 30 Minutes Intravenous Every 6 hours 03/28/14 1304 03/28/14 2154   03/28/14 0731  ceFAZolin (ANCEF)  IVPB 2 g/50 mL premix     2 g 100 mL/hr over 30 Minutes Intravenous On call to O.R. 03/28/14 9450 03/28/14 1020     and started on DVT prophylaxis in the form of Xarelto.   PT and OT were ordered for total joint protocol.  Discharge planning consulted to help with postop disposition and equipment needs.  Patient had a decent night on the evening of surgery.  They started to get up OOB with therapy on day one. Hemovac drain was pulled without difficulty.  Continued to work with therapy into day two.  Dressing was changed on day two and the incision was healing well.  By day three, the patient had progressed with therapy and meeting their goals.  Incision was healing well.  Patient was seen in rounds by Dr. Wynelle Link and was ready to go to the SNF.  Discharge to SNF Diet - Cardiac diet and Diabetic diet Follow up - in 2 weeks Activity - WBAT Disposition - Skilled nursing facility Condition Upon Discharge - Good D/C Meds - See DC Summary DVT Prophylaxis - Xarelto  Discharge Instructions    Call MD / Call 911    Complete by:  As directed   If you experience chest pain or shortness of breath, CALL 911 and be transported to the hospital emergency room.  If you develope a fever above 101 F, pus (white drainage) or increased drainage or redness at the wound, or calf pain, call your surgeon's office.     Change dressing    Complete by:  As directed   Change dressing daily with sterile 4 x 4 inch gauze dressing and apply TED hose. Do not submerge the incision under water.     Constipation Prevention    Complete by:  As directed   Drink plenty of fluids.  Prune juice may be helpful.  You may use a stool softener, such as Colace (over the counter) 100 mg twice a day.  Use MiraLax (over the counter) for constipation as needed.     Diet - low sodium heart healthy    Complete by:  As directed      Diet Carb Modified    Complete by:  As directed      Discharge instructions    Complete by:  As directed    Pick up stool softner and laxative for home use following surgery while on pain medications. Do not submerge incision under water. Please use good hand washing techniques while changing dressing each day. May shower starting three days after surgery. Please use a clean towel to pat the incision dry following showers. Continue to use ice for pain and swelling after surgery. Do not use any lotions or creams on the incision until instructed by your surgeon.  Take Xarelto for two and a half more weeks, then discontinue Xarelto. Once the patient has completed the  blood thinner regimen, then take a Baby 81 mg Aspirin daily for three more weeks.  Postoperative Constipation Protocol  Constipation - defined medically as fewer than three stools per week and severe constipation as less than one stool per week.  One of the most common issues patients have following surgery is constipation.  Even if you have a regular bowel pattern at home, your normal regimen is likely to be disrupted due to multiple reasons following surgery.  Combination of anesthesia, postoperative narcotics, change in appetite and fluid intake all can affect your bowels.  In order to avoid complications following surgery, here are some recommendations in order to help you during your recovery period.  Colace (docusate) - Pick up an over-the-counter form of Colace or another stool softener and take twice a day as long as you are requiring postoperative pain medications.  Take with a full glass of water daily.  If you experience loose stools or diarrhea, hold the colace until you stool forms back up.  If your symptoms do not get better within 1 week or if they get worse, check with your doctor.  Dulcolax (bisacodyl) - Pick up over-the-counter and take as directed by the product packaging as needed to assist with the movement of your bowels.  Take with a full glass of water.  Use this product as needed if not relieved by Colace only.    MiraLax (polyethylene glycol) - Pick up over-the-counter to have on hand.  MiraLax is a solution that will increase the amount of water in your bowels to assist with bowel movements.  Take as directed and can mix with a glass of water, juice, soda, coffee, or tea.  Take if you go more than two days without a movement. Do not use MiraLax more than once per day. Call your doctor if you are still constipated or irregular after using this medication for 7 days in a row.  If you continue to have problems with postoperative constipation, please contact the office for further assistance and recommendations.  If you experience "the worst abdominal pain ever" or develop nausea or vomiting, please contact the office immediatly for further recommendations for treatment.  When discharged from the skilled rehab facility, please have the facility set up the patient's [place] prior to being released.   Also provide the patient with their medications at time of release from the facility to include their pain medication, the muscle relaxants, and their blood thinner medication.  If the patient is still at the rehab facility at time of follow up appointment, please also assist the patient in arranging follow up appointment in our office and any transportation needs. ICE to the affected knee or hip every three hours for 30 minutes at a time and then as needed for pain and swelling.     Do not put a pillow under the knee. Place it under the heel.    Complete by:  As directed      Do not sit on low chairs, stoools or toilet seats, as it may be difficult to get up from low surfaces    Complete by:  As directed      Driving restrictions    Complete by:  As directed   No driving until released by the physician.     Increase activity slowly as tolerated    Complete by:  As directed      Lifting restrictions    Complete by:  As directed   No  lifting until released by the physician.     Patient  may shower    Complete by:  As directed   You may shower without a dressing once there is no drainage.  Do not wash over the wound.  If drainage remains, do not shower until drainage stops.     TED hose    Complete by:  As directed   Use stockings (TED hose) for 3 weeks on both leg(s).  You may remove them at night for sleeping.     Weight bearing as tolerated    Complete by:  As directed   Laterality:  right  Extremity:  Lower            Medication List    STOP taking these medications        cholecalciferol 1000 UNITS tablet  Commonly known as:  VITAMIN D     cyanocobalamin 1000 MCG/ML injection  Commonly known as:  (VITAMIN B-12)     meloxicam 15 MG tablet  Commonly known as:  MOBIC      TAKE these medications        acetaminophen 325 MG tablet  Commonly known as:  TYLENOL  Take 2 tablets (650 mg total) by mouth every 6 (six) hours as needed for mild pain (or Fever >/= 101).     benazepril 10 MG tablet  Commonly known as:  LOTENSIN  Take 10 mg by mouth every morning.  Notes to Patient:  Start 3/24 Thursday     bisacodyl 10 MG suppository  Commonly known as:  DULCOLAX  Place 1 suppository (10 mg total) rectally daily as needed for moderate constipation.     docusate sodium 100 MG capsule  Commonly known as:  COLACE  Take 1 capsule (100 mg total) by mouth 2 (two) times daily.     furosemide 20 MG tablet  Commonly known as:  LASIX  Take 20 mg by mouth daily as needed for fluid.     LANTUS 100 UNIT/ML injection  Generic drug:  insulin glargine  Inject 34 Units into the skin at bedtime.     metFORMIN 1000 MG tablet  Commonly known as:  GLUCOPHAGE  Take 1,000 mg by mouth 2 (two) times daily with a meal.     methocarbamol 500 MG tablet  Commonly known as:  ROBAXIN  Take 1 tablet (500 mg total) by mouth every 6 (six) hours as needed for muscle spasms.     metoCLOPramide 5 MG tablet  Commonly known as:  REGLAN  Take 1 tablet (5 mg total) by mouth every 8  (eight) hours as needed for nausea (if ondansetron (ZOFRAN) ineffective.).     ondansetron 4 MG tablet  Commonly known as:  ZOFRAN  Take 1 tablet (4 mg total) by mouth every 6 (six) hours as needed for nausea.     oxyCODONE 5 MG immediate release tablet  Commonly known as:  Oxy IR/ROXICODONE  Take 1-2 tablets (5-10 mg total) by mouth every 3 (three) hours as needed for moderate pain, severe pain or breakthrough pain.     polyethylene glycol packet  Commonly known as:  MIRALAX / GLYCOLAX  Take 17 g by mouth daily as needed for mild constipation.     pravastatin 40 MG tablet  Commonly known as:  PRAVACHOL  Take 40 mg by mouth daily.     rivaroxaban 10 MG Tabs tablet  Commonly known as:  XARELTO  - Take 1 tablet (10 mg total) by mouth daily  with breakfast. Take Xarelto for two and a half more weeks, then discontinue Xarelto.  - Once the patient has completed the blood thinner regimen, then take a Baby 81 mg Aspirin daily for three more weeks.     traMADol 50 MG tablet  Commonly known as:  ULTRAM  Take 1-2 tablets (50-100 mg total) by mouth every 6 (six) hours as needed (mild pain).           Follow-up Information    Follow up with [name], MD. Schedule an appointment as soon as possible for a visit on 04/05/2014.   Specialty:  Orthopedic Surgery   Why:  Call office ASAP at [phone] to set up appointment next Tuesday 3/29 with Dr. Wynelle Link.   Contact information:   [address] [place] [address] [phone]       Signed: Arlee Muslim, PA-C Orthopaedic Surgery 03/31/2014, 8:14 AM

## 2014-04-04 ENCOUNTER — Non-Acute Institutional Stay (SKILLED_NURSING_FACILITY): Payer: Medicare Other | Admitting: Adult Health

## 2014-04-04 DIAGNOSIS — M1711 Unilateral primary osteoarthritis, right knee: Secondary | ICD-10-CM

## 2014-04-04 DIAGNOSIS — D62 Acute posthemorrhagic anemia: Secondary | ICD-10-CM | POA: Diagnosis not present

## 2014-04-04 DIAGNOSIS — E119 Type 2 diabetes mellitus without complications: Secondary | ICD-10-CM

## 2014-04-04 DIAGNOSIS — E785 Hyperlipidemia, unspecified: Secondary | ICD-10-CM | POA: Diagnosis not present

## 2014-04-04 DIAGNOSIS — I10 Essential (primary) hypertension: Secondary | ICD-10-CM | POA: Diagnosis not present

## 2014-04-04 DIAGNOSIS — K5900 Constipation, unspecified: Secondary | ICD-10-CM

## 2014-04-05 ENCOUNTER — Encounter: Payer: Self-pay | Admitting: Adult Health

## 2014-04-05 DIAGNOSIS — Z471 Aftercare following joint replacement surgery: Secondary | ICD-10-CM | POA: Diagnosis not present

## 2014-04-05 DIAGNOSIS — Z96651 Presence of right artificial knee joint: Secondary | ICD-10-CM | POA: Diagnosis not present

## 2014-04-05 NOTE — Progress Notes (Signed)
Patient ID: [facility], female   DOB: 29-Aug-1928, 81 y.o.   MRN: [id number]   04/04/14  Facility:  [facility] Home Location:  [place] Room Number: 1205-P LEVEL OF CARE:  SNF (31)   Chief Complaint  Patient presents with  . Hospitalization Follow-up    Osteoarthritis S/P right total knee arthroplasty, anemia, hypertension, constipation, diabetes mellitus and hyperlipidemia    HISTORY OF PRESENT ILLNESS:  This is an 81 year old female who has been admitted to [facility] on 03/31/14 from [facility] with osteoarthritis S/P right total knee arthroplasty. She has PMH of neuropathy, stress incontinence, cataracts and  + for TB since age 54. She has been admitted for a short-term rehabilitation.  PAST MEDICAL HISTORY:  Past Medical History  Diagnosis Date  . Hyperlipidemia   . DJD (degenerative joint disease)     c spine  . Osteoarthritis   . Cataracts, bilateral   . Hypertension   . Vitamin D deficiency   . Vitamin B 12 deficiency   . Diabetes   . Neuromuscular disorder     neuropathy in feet   . Tuberculosis     hx of positive TB since age 44   . Stress incontinence     CURRENT MEDICATIONS: Reviewed per MAR/see medication list  No Known Allergies   REVIEW OF SYSTEMS:  GENERAL: no change in appetite, no fatigue, no weight changes, no fever, chills or weakness RESPIRATORY: no cough, SOB, DOE, wheezing, hemoptysis CARDIAC: no chest pain, or palpitations GI: no abdominal pain, diarrhea, constipation, heart burn, nausea or vomiting  PHYSICAL EXAMINATION  GENERAL: no acute distress, normal body habitus SKIN:  Right knee surgical incision is dry, covered with dry dressing, no redness; moderate bruising on right thigh EYES: conjunctivae normal, sclerae normal, normal eye lids NECK: supple, trachea midline, no neck masses, no thyroid tenderness, no thyromegaly LYMPHATICS: no LAN in the neck, no supraclavicular LAN RESPIRATORY:  breathing is even & unlabored, BS CTAB CARDIAC: RRR, no murmur,no extra heart sounds, no edema GI: abdomen soft, normal BS, no masses, no tenderness, no hepatomegaly, no splenomegaly EXTREMITIES:  Able to move 4 extremities; RLE edema 1+ PSYCHIATRIC: the patient is alert & oriented to person, affect & behavior appropriate  LABS/RADIOLOGY: Labs reviewed: Basic Metabolic Panel:  Recent Labs  03/22/14 1515 03/29/14 0604 03/30/14 0510  NA 139 137 140  K 4.0 4.5 4.5  CL 106 103 104  CO2 28 25 27   GLUCOSE 144* 189* 192*  BUN 15 17 19   CREATININE 0.82 0.89 0.84  CALCIUM 9.5 8.6 9.0   Liver Function Tests:  Recent Labs  03/22/14 1515  AST 20  ALT 16  ALKPHOS 53  BILITOT 0.3  PROT 7.1  ALBUMIN 4.2   CBC:  Recent Labs  03/29/14 0604 03/30/14 0510 03/31/14 0510  WBC 12.9* 12.0* 10.2  HGB 10.7* 9.8* 10.0*  HCT 32.1* 29.3* 29.8*  MCV 90.2 90.7 91.1  PLT 270 224 249   CBG:  Recent Labs  03/30/14 2226 03/31/14 0730 03/31/14 1156  GLUCAP 122* 84 154*     ASSESSMENT/PLAN:  Osteoarthritis S/P right total knee arthroplasty - for rehabilitation; continue old from 50 mg 1-2 tabs by mouth every 6 hours when necessary and oxycodone 5 mg 1-2 tabs by mouth every 3 hours when necessary for pain; Xarelto 10 mg 1 tab by mouth daily for a total of 2 1/2weeks then aspirin 81 mg by mouth daily 3 weeks for  DVT prophylaxis; Robaxin 500 mg 1 tab by mouth every 6 hours when necessary for muscle spasm  Anemia, acute blood loss - hemoglobin 10.0; stable Hypertension - well-controlled; continue lisinopril 10 mg 1 tab by mouth daily Constipation - continue Colace 100 mg by mouth twice a day and MiraLAX when necessary Diabetes mellitus, type II - continue metformin 1000 mg by mouth twice a day Hyperlipidemia - continue atorvastatin 10 mg by mouth daily   Goals of care:  Short-term rehabilitation   Labs/test ordered:  none  Spent 50 minutes in patient care.      [facility], NP [employer] [phone]

## 2014-04-08 DIAGNOSIS — M1711 Unilateral primary osteoarthritis, right knee: Secondary | ICD-10-CM | POA: Diagnosis not present

## 2014-04-12 DIAGNOSIS — M1711 Unilateral primary osteoarthritis, right knee: Secondary | ICD-10-CM | POA: Diagnosis not present

## 2014-04-15 DIAGNOSIS — M1711 Unilateral primary osteoarthritis, right knee: Secondary | ICD-10-CM | POA: Diagnosis not present

## 2014-04-19 DIAGNOSIS — M1711 Unilateral primary osteoarthritis, right knee: Secondary | ICD-10-CM | POA: Diagnosis not present

## 2014-04-22 DIAGNOSIS — M1711 Unilateral primary osteoarthritis, right knee: Secondary | ICD-10-CM | POA: Diagnosis not present

## 2014-04-26 DIAGNOSIS — M1711 Unilateral primary osteoarthritis, right knee: Secondary | ICD-10-CM | POA: Diagnosis not present

## 2014-04-28 DIAGNOSIS — Z96651 Presence of right artificial knee joint: Secondary | ICD-10-CM | POA: Diagnosis not present

## 2014-04-28 DIAGNOSIS — Z471 Aftercare following joint replacement surgery: Secondary | ICD-10-CM | POA: Diagnosis not present

## 2014-04-29 DIAGNOSIS — M1711 Unilateral primary osteoarthritis, right knee: Secondary | ICD-10-CM | POA: Diagnosis not present

## 2014-05-03 DIAGNOSIS — M1711 Unilateral primary osteoarthritis, right knee: Secondary | ICD-10-CM | POA: Diagnosis not present

## 2014-05-06 DIAGNOSIS — M1711 Unilateral primary osteoarthritis, right knee: Secondary | ICD-10-CM | POA: Diagnosis not present

## 2014-05-06 DIAGNOSIS — H43813 Vitreous degeneration, bilateral: Secondary | ICD-10-CM | POA: Diagnosis not present

## 2014-05-06 DIAGNOSIS — Z961 Presence of intraocular lens: Secondary | ICD-10-CM | POA: Diagnosis not present

## 2014-05-06 DIAGNOSIS — E119 Type 2 diabetes mellitus without complications: Secondary | ICD-10-CM | POA: Diagnosis not present

## 2014-05-06 DIAGNOSIS — H40013 Open angle with borderline findings, low risk, bilateral: Secondary | ICD-10-CM | POA: Diagnosis not present

## 2014-05-10 DIAGNOSIS — M1711 Unilateral primary osteoarthritis, right knee: Secondary | ICD-10-CM | POA: Diagnosis not present

## 2014-05-13 DIAGNOSIS — M1711 Unilateral primary osteoarthritis, right knee: Secondary | ICD-10-CM | POA: Diagnosis not present

## 2014-05-16 DIAGNOSIS — M1711 Unilateral primary osteoarthritis, right knee: Secondary | ICD-10-CM | POA: Diagnosis not present

## 2014-05-19 DIAGNOSIS — M1711 Unilateral primary osteoarthritis, right knee: Secondary | ICD-10-CM | POA: Diagnosis not present

## 2014-05-24 DIAGNOSIS — M1711 Unilateral primary osteoarthritis, right knee: Secondary | ICD-10-CM | POA: Diagnosis not present

## 2014-05-26 DIAGNOSIS — M1711 Unilateral primary osteoarthritis, right knee: Secondary | ICD-10-CM | POA: Diagnosis not present

## 2014-05-31 DIAGNOSIS — M1711 Unilateral primary osteoarthritis, right knee: Secondary | ICD-10-CM | POA: Diagnosis not present

## 2014-06-02 DIAGNOSIS — Z96651 Presence of right artificial knee joint: Secondary | ICD-10-CM | POA: Diagnosis not present

## 2014-06-02 DIAGNOSIS — Z471 Aftercare following joint replacement surgery: Secondary | ICD-10-CM | POA: Diagnosis not present

## 2014-06-08 DIAGNOSIS — M179 Osteoarthritis of knee, unspecified: Secondary | ICD-10-CM | POA: Diagnosis not present

## 2014-06-08 DIAGNOSIS — Z1389 Encounter for screening for other disorder: Secondary | ICD-10-CM | POA: Diagnosis not present

## 2014-06-08 DIAGNOSIS — E785 Hyperlipidemia, unspecified: Secondary | ICD-10-CM | POA: Diagnosis not present

## 2014-06-08 DIAGNOSIS — Z6828 Body mass index (BMI) 28.0-28.9, adult: Secondary | ICD-10-CM | POA: Diagnosis not present

## 2014-06-08 DIAGNOSIS — E119 Type 2 diabetes mellitus without complications: Secondary | ICD-10-CM | POA: Diagnosis not present

## 2014-06-08 DIAGNOSIS — M8580 Other specified disorders of bone density and structure, unspecified site: Secondary | ICD-10-CM | POA: Diagnosis not present

## 2014-06-08 DIAGNOSIS — H259 Unspecified age-related cataract: Secondary | ICD-10-CM | POA: Diagnosis not present

## 2014-06-08 DIAGNOSIS — M1990 Unspecified osteoarthritis, unspecified site: Secondary | ICD-10-CM | POA: Diagnosis not present

## 2014-06-08 DIAGNOSIS — E559 Vitamin D deficiency, unspecified: Secondary | ICD-10-CM | POA: Diagnosis not present

## 2014-07-05 DIAGNOSIS — Z96651 Presence of right artificial knee joint: Secondary | ICD-10-CM | POA: Diagnosis not present

## 2014-07-05 DIAGNOSIS — Z471 Aftercare following joint replacement surgery: Secondary | ICD-10-CM | POA: Diagnosis not present

## 2014-08-03 DIAGNOSIS — H903 Sensorineural hearing loss, bilateral: Secondary | ICD-10-CM | POA: Diagnosis not present

## 2014-09-07 DIAGNOSIS — M1990 Unspecified osteoarthritis, unspecified site: Secondary | ICD-10-CM | POA: Diagnosis not present

## 2014-09-07 DIAGNOSIS — E1136 Type 2 diabetes mellitus with diabetic cataract: Secondary | ICD-10-CM | POA: Diagnosis not present

## 2014-09-07 DIAGNOSIS — Z6829 Body mass index (BMI) 29.0-29.9, adult: Secondary | ICD-10-CM | POA: Diagnosis not present

## 2014-09-07 DIAGNOSIS — E559 Vitamin D deficiency, unspecified: Secondary | ICD-10-CM | POA: Diagnosis not present

## 2014-09-07 DIAGNOSIS — Z23 Encounter for immunization: Secondary | ICD-10-CM | POA: Diagnosis not present

## 2014-09-07 DIAGNOSIS — M859 Disorder of bone density and structure, unspecified: Secondary | ICD-10-CM | POA: Diagnosis not present

## 2014-09-07 DIAGNOSIS — M179 Osteoarthritis of knee, unspecified: Secondary | ICD-10-CM | POA: Diagnosis not present

## 2014-09-07 DIAGNOSIS — H9190 Unspecified hearing loss, unspecified ear: Secondary | ICD-10-CM | POA: Diagnosis not present

## 2014-09-07 DIAGNOSIS — D519 Vitamin B12 deficiency anemia, unspecified: Secondary | ICD-10-CM | POA: Diagnosis not present

## 2014-09-07 DIAGNOSIS — E785 Hyperlipidemia, unspecified: Secondary | ICD-10-CM | POA: Diagnosis not present

## 2014-09-07 DIAGNOSIS — M353 Polymyalgia rheumatica: Secondary | ICD-10-CM | POA: Diagnosis not present

## 2014-09-07 DIAGNOSIS — I10 Essential (primary) hypertension: Secondary | ICD-10-CM | POA: Diagnosis not present

## 2014-09-13 ENCOUNTER — Other Ambulatory Visit: Payer: Self-pay

## 2014-09-13 DIAGNOSIS — Z1231 Encounter for screening mammogram for malignant neoplasm of breast: Secondary | ICD-10-CM

## 2014-09-21 ENCOUNTER — Ambulatory Visit
Admission: RE | Admit: 2014-09-21 | Discharge: 2014-09-21 | Disposition: A | Discharge status: Home / Self Care | Payer: Medicare Other | Source: Ambulatory Visit

## 2014-09-21 DIAGNOSIS — Z1231 Encounter for screening mammogram for malignant neoplasm of breast: Secondary | ICD-10-CM | POA: Diagnosis not present

## 2014-11-02 DIAGNOSIS — H40013 Open angle with borderline findings, low risk, bilateral: Secondary | ICD-10-CM | POA: Diagnosis not present

## 2014-12-07 DIAGNOSIS — D519 Vitamin B12 deficiency anemia, unspecified: Secondary | ICD-10-CM | POA: Diagnosis not present

## 2014-12-07 DIAGNOSIS — M859 Disorder of bone density and structure, unspecified: Secondary | ICD-10-CM | POA: Diagnosis not present

## 2014-12-07 DIAGNOSIS — E119 Type 2 diabetes mellitus without complications: Secondary | ICD-10-CM | POA: Diagnosis not present

## 2014-12-07 DIAGNOSIS — E784 Other hyperlipidemia: Secondary | ICD-10-CM | POA: Diagnosis not present

## 2014-12-14 DIAGNOSIS — Z0000 Encounter for general adult medical examination without abnormal findings: Secondary | ICD-10-CM | POA: Diagnosis not present

## 2014-12-14 DIAGNOSIS — M859 Disorder of bone density and structure, unspecified: Secondary | ICD-10-CM | POA: Diagnosis not present

## 2014-12-14 DIAGNOSIS — I10 Essential (primary) hypertension: Secondary | ICD-10-CM | POA: Diagnosis not present

## 2014-12-14 DIAGNOSIS — Z6829 Body mass index (BMI) 29.0-29.9, adult: Secondary | ICD-10-CM | POA: Diagnosis not present

## 2014-12-14 DIAGNOSIS — H259 Unspecified age-related cataract: Secondary | ICD-10-CM | POA: Diagnosis not present

## 2014-12-14 DIAGNOSIS — M353 Polymyalgia rheumatica: Secondary | ICD-10-CM | POA: Diagnosis not present

## 2014-12-14 DIAGNOSIS — E1136 Type 2 diabetes mellitus with diabetic cataract: Secondary | ICD-10-CM | POA: Diagnosis not present

## 2014-12-14 DIAGNOSIS — Z1389 Encounter for screening for other disorder: Secondary | ICD-10-CM | POA: Diagnosis not present

## 2014-12-14 DIAGNOSIS — R209 Unspecified disturbances of skin sensation: Secondary | ICD-10-CM | POA: Diagnosis not present

## 2014-12-14 DIAGNOSIS — Z794 Long term (current) use of insulin: Secondary | ICD-10-CM | POA: Diagnosis not present

## 2014-12-14 DIAGNOSIS — H9190 Unspecified hearing loss, unspecified ear: Secondary | ICD-10-CM | POA: Diagnosis not present

## 2014-12-14 DIAGNOSIS — D692 Other nonthrombocytopenic purpura: Secondary | ICD-10-CM | POA: Diagnosis not present

## 2015-01-19 DIAGNOSIS — E119 Type 2 diabetes mellitus without complications: Secondary | ICD-10-CM | POA: Diagnosis not present

## 2015-01-19 DIAGNOSIS — Z6829 Body mass index (BMI) 29.0-29.9, adult: Secondary | ICD-10-CM | POA: Diagnosis not present

## 2015-01-19 DIAGNOSIS — I10 Essential (primary) hypertension: Secondary | ICD-10-CM | POA: Diagnosis not present

## 2015-02-22 DIAGNOSIS — Z0100 Encounter for examination of eyes and vision without abnormal findings: Secondary | ICD-10-CM | POA: Diagnosis not present

## 2015-02-22 DIAGNOSIS — H40012 Open angle with borderline findings, low risk, left eye: Secondary | ICD-10-CM | POA: Diagnosis not present

## 2015-02-22 DIAGNOSIS — H40011 Open angle with borderline findings, low risk, right eye: Secondary | ICD-10-CM | POA: Diagnosis not present

## 2015-02-22 DIAGNOSIS — E119 Type 2 diabetes mellitus without complications: Secondary | ICD-10-CM | POA: Diagnosis not present

## 2015-03-14 DIAGNOSIS — H259 Unspecified age-related cataract: Secondary | ICD-10-CM | POA: Diagnosis not present

## 2015-03-14 DIAGNOSIS — I10 Essential (primary) hypertension: Secondary | ICD-10-CM | POA: Diagnosis not present

## 2015-03-14 DIAGNOSIS — H9190 Unspecified hearing loss, unspecified ear: Secondary | ICD-10-CM | POA: Diagnosis not present

## 2015-03-14 DIAGNOSIS — D692 Other nonthrombocytopenic purpura: Secondary | ICD-10-CM | POA: Diagnosis not present

## 2015-03-14 DIAGNOSIS — M859 Disorder of bone density and structure, unspecified: Secondary | ICD-10-CM | POA: Diagnosis not present

## 2015-03-14 DIAGNOSIS — Z6830 Body mass index (BMI) 30.0-30.9, adult: Secondary | ICD-10-CM | POA: Diagnosis not present

## 2015-03-14 DIAGNOSIS — D519 Vitamin B12 deficiency anemia, unspecified: Secondary | ICD-10-CM | POA: Diagnosis not present

## 2015-03-14 DIAGNOSIS — E1136 Type 2 diabetes mellitus with diabetic cataract: Secondary | ICD-10-CM | POA: Diagnosis not present

## 2015-03-14 DIAGNOSIS — E559 Vitamin D deficiency, unspecified: Secondary | ICD-10-CM | POA: Diagnosis not present

## 2015-03-14 DIAGNOSIS — Z794 Long term (current) use of insulin: Secondary | ICD-10-CM | POA: Diagnosis not present

## 2015-03-14 DIAGNOSIS — M353 Polymyalgia rheumatica: Secondary | ICD-10-CM | POA: Diagnosis not present

## 2015-03-14 DIAGNOSIS — M1990 Unspecified osteoarthritis, unspecified site: Secondary | ICD-10-CM | POA: Diagnosis not present

## 2015-04-07 DIAGNOSIS — Z96651 Presence of right artificial knee joint: Secondary | ICD-10-CM | POA: Diagnosis not present

## 2015-04-07 DIAGNOSIS — Z471 Aftercare following joint replacement surgery: Secondary | ICD-10-CM | POA: Diagnosis not present

## 2015-05-12 DIAGNOSIS — M2042 Other hammer toe(s) (acquired), left foot: Secondary | ICD-10-CM | POA: Diagnosis not present

## 2015-06-07 DIAGNOSIS — M79642 Pain in left hand: Secondary | ICD-10-CM | POA: Diagnosis not present

## 2015-06-07 DIAGNOSIS — Z6830 Body mass index (BMI) 30.0-30.9, adult: Secondary | ICD-10-CM | POA: Diagnosis not present

## 2015-06-07 DIAGNOSIS — E119 Type 2 diabetes mellitus without complications: Secondary | ICD-10-CM | POA: Diagnosis not present

## 2015-06-27 DIAGNOSIS — H40011 Open angle with borderline findings, low risk, right eye: Secondary | ICD-10-CM | POA: Diagnosis not present

## 2015-06-27 DIAGNOSIS — H40012 Open angle with borderline findings, low risk, left eye: Secondary | ICD-10-CM | POA: Diagnosis not present

## 2015-06-28 DIAGNOSIS — N183 Chronic kidney disease, stage 3 (moderate): Secondary | ICD-10-CM | POA: Diagnosis not present

## 2015-06-28 DIAGNOSIS — E1129 Type 2 diabetes mellitus with other diabetic kidney complication: Secondary | ICD-10-CM | POA: Diagnosis not present

## 2015-06-28 DIAGNOSIS — D692 Other nonthrombocytopenic purpura: Secondary | ICD-10-CM | POA: Diagnosis not present

## 2015-06-28 DIAGNOSIS — Z6829 Body mass index (BMI) 29.0-29.9, adult: Secondary | ICD-10-CM | POA: Diagnosis not present

## 2015-06-28 DIAGNOSIS — M859 Disorder of bone density and structure, unspecified: Secondary | ICD-10-CM | POA: Diagnosis not present

## 2015-06-28 DIAGNOSIS — I129 Hypertensive chronic kidney disease with stage 1 through stage 4 chronic kidney disease, or unspecified chronic kidney disease: Secondary | ICD-10-CM | POA: Diagnosis not present

## 2015-06-28 DIAGNOSIS — E7800 Pure hypercholesterolemia, unspecified: Secondary | ICD-10-CM | POA: Diagnosis not present

## 2015-06-28 DIAGNOSIS — I10 Essential (primary) hypertension: Secondary | ICD-10-CM | POA: Diagnosis not present

## 2015-06-28 DIAGNOSIS — H9190 Unspecified hearing loss, unspecified ear: Secondary | ICD-10-CM | POA: Diagnosis not present

## 2015-06-28 DIAGNOSIS — E559 Vitamin D deficiency, unspecified: Secondary | ICD-10-CM | POA: Diagnosis not present

## 2015-06-28 DIAGNOSIS — Z794 Long term (current) use of insulin: Secondary | ICD-10-CM | POA: Diagnosis not present

## 2015-06-28 DIAGNOSIS — E1136 Type 2 diabetes mellitus with diabetic cataract: Secondary | ICD-10-CM | POA: Diagnosis not present

## 2015-09-26 ENCOUNTER — Other Ambulatory Visit: Payer: Self-pay | Admitting: Internal Medicine

## 2015-09-26 ENCOUNTER — Ambulatory Visit
Admission: RE | Admit: 2015-09-26 | Discharge: 2015-09-26 | Disposition: A | Discharge status: Home / Self Care | Payer: Medicare Other | Source: Ambulatory Visit | Attending: Internal Medicine | Admitting: Internal Medicine

## 2015-09-26 DIAGNOSIS — Z1231 Encounter for screening mammogram for malignant neoplasm of breast: Secondary | ICD-10-CM | POA: Diagnosis not present

## 2015-09-28 DIAGNOSIS — Z6828 Body mass index (BMI) 28.0-28.9, adult: Secondary | ICD-10-CM | POA: Diagnosis not present

## 2015-09-28 DIAGNOSIS — E1129 Type 2 diabetes mellitus with other diabetic kidney complication: Secondary | ICD-10-CM | POA: Diagnosis not present

## 2015-09-28 DIAGNOSIS — Z794 Long term (current) use of insulin: Secondary | ICD-10-CM | POA: Diagnosis not present

## 2015-09-28 DIAGNOSIS — D519 Vitamin B12 deficiency anemia, unspecified: Secondary | ICD-10-CM | POA: Diagnosis not present

## 2015-09-28 DIAGNOSIS — E1136 Type 2 diabetes mellitus with diabetic cataract: Secondary | ICD-10-CM | POA: Diagnosis not present

## 2015-09-28 DIAGNOSIS — E7800 Pure hypercholesterolemia, unspecified: Secondary | ICD-10-CM | POA: Diagnosis not present

## 2015-09-28 DIAGNOSIS — N183 Chronic kidney disease, stage 3 (moderate): Secondary | ICD-10-CM | POA: Diagnosis not present

## 2015-09-28 DIAGNOSIS — D692 Other nonthrombocytopenic purpura: Secondary | ICD-10-CM | POA: Diagnosis not present

## 2015-09-28 DIAGNOSIS — E559 Vitamin D deficiency, unspecified: Secondary | ICD-10-CM | POA: Diagnosis not present

## 2015-09-28 DIAGNOSIS — I10 Essential (primary) hypertension: Secondary | ICD-10-CM | POA: Diagnosis not present

## 2015-09-28 DIAGNOSIS — Z23 Encounter for immunization: Secondary | ICD-10-CM | POA: Diagnosis not present

## 2015-09-28 DIAGNOSIS — Z1389 Encounter for screening for other disorder: Secondary | ICD-10-CM | POA: Diagnosis not present

## 2015-09-28 DIAGNOSIS — I129 Hypertensive chronic kidney disease with stage 1 through stage 4 chronic kidney disease, or unspecified chronic kidney disease: Secondary | ICD-10-CM | POA: Diagnosis not present

## 2015-10-03 ENCOUNTER — Ambulatory Visit: Payer: Medicare Other

## 2015-12-11 DIAGNOSIS — I10 Essential (primary) hypertension: Secondary | ICD-10-CM | POA: Diagnosis not present

## 2015-12-11 DIAGNOSIS — E538 Deficiency of other specified B group vitamins: Secondary | ICD-10-CM | POA: Diagnosis not present

## 2015-12-11 DIAGNOSIS — E7800 Pure hypercholesterolemia, unspecified: Secondary | ICD-10-CM | POA: Diagnosis not present

## 2015-12-11 DIAGNOSIS — M859 Disorder of bone density and structure, unspecified: Secondary | ICD-10-CM | POA: Diagnosis not present

## 2015-12-11 DIAGNOSIS — E1129 Type 2 diabetes mellitus with other diabetic kidney complication: Secondary | ICD-10-CM | POA: Diagnosis not present

## 2015-12-11 DIAGNOSIS — N390 Urinary tract infection, site not specified: Secondary | ICD-10-CM | POA: Diagnosis not present

## 2015-12-18 DIAGNOSIS — D519 Vitamin B12 deficiency anemia, unspecified: Secondary | ICD-10-CM | POA: Diagnosis not present

## 2015-12-18 DIAGNOSIS — E559 Vitamin D deficiency, unspecified: Secondary | ICD-10-CM | POA: Diagnosis not present

## 2015-12-18 DIAGNOSIS — E1136 Type 2 diabetes mellitus with diabetic cataract: Secondary | ICD-10-CM | POA: Diagnosis not present

## 2015-12-18 DIAGNOSIS — Z6829 Body mass index (BMI) 29.0-29.9, adult: Secondary | ICD-10-CM | POA: Diagnosis not present

## 2015-12-18 DIAGNOSIS — Z0000 Encounter for general adult medical examination without abnormal findings: Secondary | ICD-10-CM | POA: Diagnosis not present

## 2015-12-18 DIAGNOSIS — I129 Hypertensive chronic kidney disease with stage 1 through stage 4 chronic kidney disease, or unspecified chronic kidney disease: Secondary | ICD-10-CM | POA: Diagnosis not present

## 2015-12-18 DIAGNOSIS — D692 Other nonthrombocytopenic purpura: Secondary | ICD-10-CM | POA: Diagnosis not present

## 2015-12-18 DIAGNOSIS — Z794 Long term (current) use of insulin: Secondary | ICD-10-CM | POA: Diagnosis not present

## 2015-12-18 DIAGNOSIS — E7800 Pure hypercholesterolemia, unspecified: Secondary | ICD-10-CM | POA: Diagnosis not present

## 2015-12-18 DIAGNOSIS — N183 Chronic kidney disease, stage 3 (moderate): Secondary | ICD-10-CM | POA: Diagnosis not present

## 2015-12-18 DIAGNOSIS — E1129 Type 2 diabetes mellitus with other diabetic kidney complication: Secondary | ICD-10-CM | POA: Diagnosis not present

## 2015-12-18 DIAGNOSIS — M353 Polymyalgia rheumatica: Secondary | ICD-10-CM | POA: Diagnosis not present

## 2015-12-26 DIAGNOSIS — H43813 Vitreous degeneration, bilateral: Secondary | ICD-10-CM | POA: Diagnosis not present

## 2015-12-26 DIAGNOSIS — E119 Type 2 diabetes mellitus without complications: Secondary | ICD-10-CM | POA: Diagnosis not present

## 2015-12-26 DIAGNOSIS — H40012 Open angle with borderline findings, low risk, left eye: Secondary | ICD-10-CM | POA: Diagnosis not present

## 2015-12-26 DIAGNOSIS — H40011 Open angle with borderline findings, low risk, right eye: Secondary | ICD-10-CM | POA: Diagnosis not present

## 2016-01-09 ENCOUNTER — Ambulatory Visit (INDEPENDENT_AMBULATORY_CARE_PROVIDER_SITE_OTHER): Payer: Medicare Other | Admitting: Podiatry

## 2016-01-09 ENCOUNTER — Encounter: Payer: Self-pay | Admitting: Podiatry

## 2016-01-09 VITALS — BP 129/77 | HR 96 | Resp 14

## 2016-01-09 DIAGNOSIS — B351 Tinea unguium: Secondary | ICD-10-CM

## 2016-01-09 DIAGNOSIS — Q828 Other specified congenital malformations of skin: Secondary | ICD-10-CM

## 2016-01-09 DIAGNOSIS — M79674 Pain in right toe(s): Secondary | ICD-10-CM | POA: Diagnosis not present

## 2016-01-09 DIAGNOSIS — L84 Corns and callosities: Secondary | ICD-10-CM

## 2016-01-09 DIAGNOSIS — M2010 Hallux valgus (acquired), unspecified foot: Secondary | ICD-10-CM | POA: Diagnosis not present

## 2016-01-09 DIAGNOSIS — E119 Type 2 diabetes mellitus without complications: Secondary | ICD-10-CM | POA: Diagnosis not present

## 2016-01-09 NOTE — Progress Notes (Signed)
   Subjective:    Patient ID: Laura Lowery, female    DOB: 31-Jul-1928, 83 y.o.   MRN: [id number]  HPI this patient presents the office stating she wants the calluses on the big toes of both feet to be examined. She says she was seen by medical doctor who referred her to this office for care and evaluation of the skin lesion under both big toes. She says that she has been applying Eucerin to the calluses after the doctor referred her to this office. She returns the office saying she is not having any pain or discomfort at the site of the calluses. She is also having pain and discomfort in the big toe of the right foot DUE to deformity of the right big toe.  pATIENT SAYS SHE IS DIABETIC.    Review of Systems  All other systems reviewed and are negative.      Objective:   Physical Exam GENERAL APPEARANCE: Alert, conversant. Appropriately groomed. No acute distress.  VASCULAR: Pedal pulses are  palpable at  [facility] and PT bilateral.  Capillary refill time is immediate to all digits,  Normal temperature gradient.  Digital hair growth is present bilateral  NEUROLOGIC: sensation is normal to 5.07 monofilament at 5/5 sites bilateral.  Light touch is intact bilateral, Muscle strength normal.  MUSCULOSKELETAL: acceptable muscle strength, tone and stability bilateral.  Intrinsic muscluature intact bilateral.  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  No preulcerative lesions or ulcers  are seen, no interdigital maceration noted.  No open lesions present.   No drainage noted. Pinch callus  B/L  NAILS  Thick disfigured discolored nail right hallux.     Diagnosis  Onychomycosis right hallux  HAV  B/L   IE  Debride right hallux.  Debride callus.  RTC 4 months.   Gardiner Barefoot DPM                            Assessment & Plan:

## 2016-02-05 DIAGNOSIS — L570 Actinic keratosis: Secondary | ICD-10-CM | POA: Diagnosis not present

## 2016-02-05 DIAGNOSIS — D225 Melanocytic nevi of trunk: Secondary | ICD-10-CM | POA: Diagnosis not present

## 2016-02-05 DIAGNOSIS — L853 Xerosis cutis: Secondary | ICD-10-CM | POA: Diagnosis not present

## 2016-02-05 DIAGNOSIS — L821 Other seborrheic keratosis: Secondary | ICD-10-CM | POA: Diagnosis not present

## 2016-02-05 DIAGNOSIS — L918 Other hypertrophic disorders of the skin: Secondary | ICD-10-CM | POA: Diagnosis not present

## 2016-02-05 DIAGNOSIS — D692 Other nonthrombocytopenic purpura: Secondary | ICD-10-CM | POA: Diagnosis not present

## 2016-02-05 DIAGNOSIS — L814 Other melanin hyperpigmentation: Secondary | ICD-10-CM | POA: Diagnosis not present

## 2016-05-07 ENCOUNTER — Ambulatory Visit: Payer: Medicare Other | Admitting: Podiatry

## 2016-05-20 DIAGNOSIS — H40013 Open angle with borderline findings, low risk, bilateral: Secondary | ICD-10-CM | POA: Diagnosis not present

## 2016-06-18 DIAGNOSIS — E7800 Pure hypercholesterolemia, unspecified: Secondary | ICD-10-CM | POA: Diagnosis not present

## 2016-06-18 DIAGNOSIS — D519 Vitamin B12 deficiency anemia, unspecified: Secondary | ICD-10-CM | POA: Diagnosis not present

## 2016-06-18 DIAGNOSIS — M1990 Unspecified osteoarthritis, unspecified site: Secondary | ICD-10-CM | POA: Diagnosis not present

## 2016-06-18 DIAGNOSIS — E1129 Type 2 diabetes mellitus with other diabetic kidney complication: Secondary | ICD-10-CM | POA: Diagnosis not present

## 2016-06-18 DIAGNOSIS — M179 Osteoarthritis of knee, unspecified: Secondary | ICD-10-CM | POA: Diagnosis not present

## 2016-06-18 DIAGNOSIS — I129 Hypertensive chronic kidney disease with stage 1 through stage 4 chronic kidney disease, or unspecified chronic kidney disease: Secondary | ICD-10-CM | POA: Diagnosis not present

## 2016-06-18 DIAGNOSIS — N183 Chronic kidney disease, stage 3 (moderate): Secondary | ICD-10-CM | POA: Diagnosis not present

## 2016-06-18 DIAGNOSIS — L84 Corns and callosities: Secondary | ICD-10-CM | POA: Diagnosis not present

## 2016-06-18 DIAGNOSIS — E559 Vitamin D deficiency, unspecified: Secondary | ICD-10-CM | POA: Diagnosis not present

## 2016-06-18 DIAGNOSIS — Z6829 Body mass index (BMI) 29.0-29.9, adult: Secondary | ICD-10-CM | POA: Diagnosis not present

## 2016-06-18 DIAGNOSIS — D518 Other vitamin B12 deficiency anemias: Secondary | ICD-10-CM | POA: Diagnosis not present

## 2016-06-18 DIAGNOSIS — Z794 Long term (current) use of insulin: Secondary | ICD-10-CM | POA: Diagnosis not present

## 2016-06-18 DIAGNOSIS — E1136 Type 2 diabetes mellitus with diabetic cataract: Secondary | ICD-10-CM | POA: Diagnosis not present

## 2016-08-16 ENCOUNTER — Other Ambulatory Visit: Payer: Self-pay | Admitting: Internal Medicine

## 2016-08-16 DIAGNOSIS — Z1231 Encounter for screening mammogram for malignant neoplasm of breast: Secondary | ICD-10-CM

## 2016-10-29 ENCOUNTER — Ambulatory Visit
Admission: RE | Admit: 2016-10-29 | Discharge: 2016-10-29 | Disposition: A | Discharge status: Home / Self Care | Payer: Medicare Other | Source: Ambulatory Visit | Attending: Internal Medicine | Admitting: Internal Medicine

## 2016-10-29 DIAGNOSIS — Z1231 Encounter for screening mammogram for malignant neoplasm of breast: Secondary | ICD-10-CM | POA: Diagnosis not present

## 2016-11-19 DIAGNOSIS — E119 Type 2 diabetes mellitus without complications: Secondary | ICD-10-CM | POA: Diagnosis not present

## 2016-11-19 DIAGNOSIS — H52203 Unspecified astigmatism, bilateral: Secondary | ICD-10-CM | POA: Diagnosis not present

## 2016-11-19 DIAGNOSIS — H43813 Vitreous degeneration, bilateral: Secondary | ICD-10-CM | POA: Diagnosis not present

## 2016-11-19 DIAGNOSIS — H40013 Open angle with borderline findings, low risk, bilateral: Secondary | ICD-10-CM | POA: Diagnosis not present

## 2016-12-04 DIAGNOSIS — H40011 Open angle with borderline findings, low risk, right eye: Secondary | ICD-10-CM | POA: Diagnosis not present

## 2016-12-19 DIAGNOSIS — R82998 Other abnormal findings in urine: Secondary | ICD-10-CM | POA: Diagnosis not present

## 2016-12-19 DIAGNOSIS — E7800 Pure hypercholesterolemia, unspecified: Secondary | ICD-10-CM | POA: Diagnosis not present

## 2016-12-19 DIAGNOSIS — N183 Chronic kidney disease, stage 3 (moderate): Secondary | ICD-10-CM | POA: Diagnosis not present

## 2016-12-19 DIAGNOSIS — E559 Vitamin D deficiency, unspecified: Secondary | ICD-10-CM | POA: Diagnosis not present

## 2016-12-19 DIAGNOSIS — E1129 Type 2 diabetes mellitus with other diabetic kidney complication: Secondary | ICD-10-CM | POA: Diagnosis not present

## 2016-12-19 DIAGNOSIS — D518 Other vitamin B12 deficiency anemias: Secondary | ICD-10-CM | POA: Diagnosis not present

## 2016-12-19 DIAGNOSIS — D519 Vitamin B12 deficiency anemia, unspecified: Secondary | ICD-10-CM | POA: Diagnosis not present

## 2016-12-24 DIAGNOSIS — M353 Polymyalgia rheumatica: Secondary | ICD-10-CM | POA: Diagnosis not present

## 2016-12-24 DIAGNOSIS — Z6829 Body mass index (BMI) 29.0-29.9, adult: Secondary | ICD-10-CM | POA: Diagnosis not present

## 2016-12-24 DIAGNOSIS — Z1389 Encounter for screening for other disorder: Secondary | ICD-10-CM | POA: Diagnosis not present

## 2016-12-24 DIAGNOSIS — N183 Chronic kidney disease, stage 3 (moderate): Secondary | ICD-10-CM | POA: Diagnosis not present

## 2016-12-24 DIAGNOSIS — I129 Hypertensive chronic kidney disease with stage 1 through stage 4 chronic kidney disease, or unspecified chronic kidney disease: Secondary | ICD-10-CM | POA: Diagnosis not present

## 2016-12-24 DIAGNOSIS — E1129 Type 2 diabetes mellitus with other diabetic kidney complication: Secondary | ICD-10-CM | POA: Diagnosis not present

## 2016-12-24 DIAGNOSIS — Z794 Long term (current) use of insulin: Secondary | ICD-10-CM | POA: Diagnosis not present

## 2016-12-24 DIAGNOSIS — I10 Essential (primary) hypertension: Secondary | ICD-10-CM | POA: Diagnosis not present

## 2016-12-24 DIAGNOSIS — Z0000 Encounter for general adult medical examination without abnormal findings: Secondary | ICD-10-CM | POA: Diagnosis not present

## 2016-12-24 DIAGNOSIS — E1136 Type 2 diabetes mellitus with diabetic cataract: Secondary | ICD-10-CM | POA: Diagnosis not present

## 2016-12-24 DIAGNOSIS — H9193 Unspecified hearing loss, bilateral: Secondary | ICD-10-CM | POA: Diagnosis not present

## 2016-12-24 DIAGNOSIS — Z23 Encounter for immunization: Secondary | ICD-10-CM | POA: Diagnosis not present

## 2016-12-24 DIAGNOSIS — L84 Corns and callosities: Secondary | ICD-10-CM | POA: Diagnosis not present

## 2016-12-26 DIAGNOSIS — Z1212 Encounter for screening for malignant neoplasm of rectum: Secondary | ICD-10-CM | POA: Diagnosis not present

## 2017-01-29 DIAGNOSIS — H40012 Open angle with borderline findings, low risk, left eye: Secondary | ICD-10-CM | POA: Diagnosis not present

## 2017-03-25 DIAGNOSIS — H40013 Open angle with borderline findings, low risk, bilateral: Secondary | ICD-10-CM | POA: Diagnosis not present

## 2017-04-03 DIAGNOSIS — E1129 Type 2 diabetes mellitus with other diabetic kidney complication: Secondary | ICD-10-CM | POA: Diagnosis not present

## 2017-04-03 DIAGNOSIS — N183 Chronic kidney disease, stage 3 (moderate): Secondary | ICD-10-CM | POA: Diagnosis not present

## 2017-04-03 DIAGNOSIS — E1136 Type 2 diabetes mellitus with diabetic cataract: Secondary | ICD-10-CM | POA: Diagnosis not present

## 2017-04-03 DIAGNOSIS — Z794 Long term (current) use of insulin: Secondary | ICD-10-CM | POA: Diagnosis not present

## 2017-04-03 DIAGNOSIS — Z6829 Body mass index (BMI) 29.0-29.9, adult: Secondary | ICD-10-CM | POA: Diagnosis not present

## 2017-04-03 DIAGNOSIS — Z1389 Encounter for screening for other disorder: Secondary | ICD-10-CM | POA: Diagnosis not present

## 2017-04-03 DIAGNOSIS — H9190 Unspecified hearing loss, unspecified ear: Secondary | ICD-10-CM | POA: Diagnosis not present

## 2017-04-03 DIAGNOSIS — I129 Hypertensive chronic kidney disease with stage 1 through stage 4 chronic kidney disease, or unspecified chronic kidney disease: Secondary | ICD-10-CM | POA: Diagnosis not present

## 2017-04-03 DIAGNOSIS — L84 Corns and callosities: Secondary | ICD-10-CM | POA: Diagnosis not present

## 2017-04-03 DIAGNOSIS — D692 Other nonthrombocytopenic purpura: Secondary | ICD-10-CM | POA: Diagnosis not present

## 2017-05-06 DIAGNOSIS — I10 Essential (primary) hypertension: Secondary | ICD-10-CM | POA: Diagnosis not present

## 2017-05-06 DIAGNOSIS — Z6829 Body mass index (BMI) 29.0-29.9, adult: Secondary | ICD-10-CM | POA: Diagnosis not present

## 2017-05-06 DIAGNOSIS — N183 Chronic kidney disease, stage 3 (moderate): Secondary | ICD-10-CM | POA: Diagnosis not present

## 2017-05-06 DIAGNOSIS — Z794 Long term (current) use of insulin: Secondary | ICD-10-CM | POA: Diagnosis not present

## 2017-05-06 DIAGNOSIS — L84 Corns and callosities: Secondary | ICD-10-CM | POA: Diagnosis not present

## 2017-05-06 DIAGNOSIS — E1129 Type 2 diabetes mellitus with other diabetic kidney complication: Secondary | ICD-10-CM | POA: Diagnosis not present

## 2017-06-17 DIAGNOSIS — N183 Chronic kidney disease, stage 3 (moderate): Secondary | ICD-10-CM | POA: Diagnosis not present

## 2017-06-17 DIAGNOSIS — I10 Essential (primary) hypertension: Secondary | ICD-10-CM | POA: Diagnosis not present

## 2017-06-17 DIAGNOSIS — Z6829 Body mass index (BMI) 29.0-29.9, adult: Secondary | ICD-10-CM | POA: Diagnosis not present

## 2017-06-17 DIAGNOSIS — Z794 Long term (current) use of insulin: Secondary | ICD-10-CM | POA: Diagnosis not present

## 2017-06-17 DIAGNOSIS — E1129 Type 2 diabetes mellitus with other diabetic kidney complication: Secondary | ICD-10-CM | POA: Diagnosis not present

## 2017-06-17 DIAGNOSIS — D519 Vitamin B12 deficiency anemia, unspecified: Secondary | ICD-10-CM | POA: Diagnosis not present

## 2017-06-17 DIAGNOSIS — D518 Other vitamin B12 deficiency anemias: Secondary | ICD-10-CM | POA: Diagnosis not present

## 2017-07-29 DIAGNOSIS — Z794 Long term (current) use of insulin: Secondary | ICD-10-CM | POA: Diagnosis not present

## 2017-07-29 DIAGNOSIS — I129 Hypertensive chronic kidney disease with stage 1 through stage 4 chronic kidney disease, or unspecified chronic kidney disease: Secondary | ICD-10-CM | POA: Diagnosis not present

## 2017-07-29 DIAGNOSIS — D692 Other nonthrombocytopenic purpura: Secondary | ICD-10-CM | POA: Diagnosis not present

## 2017-07-29 DIAGNOSIS — L84 Corns and callosities: Secondary | ICD-10-CM | POA: Diagnosis not present

## 2017-07-29 DIAGNOSIS — E1129 Type 2 diabetes mellitus with other diabetic kidney complication: Secondary | ICD-10-CM | POA: Diagnosis not present

## 2017-07-29 DIAGNOSIS — E7800 Pure hypercholesterolemia, unspecified: Secondary | ICD-10-CM | POA: Diagnosis not present

## 2017-07-29 DIAGNOSIS — E559 Vitamin D deficiency, unspecified: Secondary | ICD-10-CM | POA: Diagnosis not present

## 2017-07-29 DIAGNOSIS — E1136 Type 2 diabetes mellitus with diabetic cataract: Secondary | ICD-10-CM | POA: Diagnosis not present

## 2017-07-29 DIAGNOSIS — I10 Essential (primary) hypertension: Secondary | ICD-10-CM | POA: Diagnosis not present

## 2017-07-29 DIAGNOSIS — N183 Chronic kidney disease, stage 3 (moderate): Secondary | ICD-10-CM | POA: Diagnosis not present

## 2017-07-29 DIAGNOSIS — H9193 Unspecified hearing loss, bilateral: Secondary | ICD-10-CM | POA: Diagnosis not present

## 2017-07-29 DIAGNOSIS — Z6828 Body mass index (BMI) 28.0-28.9, adult: Secondary | ICD-10-CM | POA: Diagnosis not present

## 2017-07-29 DIAGNOSIS — H40023 Open angle with borderline findings, high risk, bilateral: Secondary | ICD-10-CM | POA: Diagnosis not present

## 2017-07-31 DIAGNOSIS — E559 Vitamin D deficiency, unspecified: Secondary | ICD-10-CM | POA: Diagnosis not present

## 2017-09-18 DIAGNOSIS — I10 Essential (primary) hypertension: Secondary | ICD-10-CM | POA: Diagnosis not present

## 2017-09-18 DIAGNOSIS — E1129 Type 2 diabetes mellitus with other diabetic kidney complication: Secondary | ICD-10-CM | POA: Diagnosis not present

## 2017-09-18 DIAGNOSIS — N183 Chronic kidney disease, stage 3 (moderate): Secondary | ICD-10-CM | POA: Diagnosis not present

## 2017-09-18 DIAGNOSIS — Z794 Long term (current) use of insulin: Secondary | ICD-10-CM | POA: Diagnosis not present

## 2017-10-20 ENCOUNTER — Other Ambulatory Visit: Payer: Self-pay | Admitting: Internal Medicine

## 2017-10-20 DIAGNOSIS — Z1231 Encounter for screening mammogram for malignant neoplasm of breast: Secondary | ICD-10-CM

## 2017-11-20 ENCOUNTER — Ambulatory Visit
Admission: RE | Admit: 2017-11-20 | Discharge: 2017-11-20 | Disposition: A | Discharge status: Home / Self Care | Payer: Medicare Other | Source: Ambulatory Visit | Attending: Internal Medicine | Admitting: Internal Medicine

## 2017-11-20 DIAGNOSIS — Z1231 Encounter for screening mammogram for malignant neoplasm of breast: Secondary | ICD-10-CM

## 2017-12-17 DIAGNOSIS — E119 Type 2 diabetes mellitus without complications: Secondary | ICD-10-CM | POA: Diagnosis not present

## 2017-12-17 DIAGNOSIS — H524 Presbyopia: Secondary | ICD-10-CM | POA: Diagnosis not present

## 2017-12-17 DIAGNOSIS — H43813 Vitreous degeneration, bilateral: Secondary | ICD-10-CM | POA: Diagnosis not present

## 2017-12-17 DIAGNOSIS — H40023 Open angle with borderline findings, high risk, bilateral: Secondary | ICD-10-CM | POA: Diagnosis not present

## 2018-01-01 DIAGNOSIS — E1129 Type 2 diabetes mellitus with other diabetic kidney complication: Secondary | ICD-10-CM | POA: Diagnosis not present

## 2018-01-01 DIAGNOSIS — E7800 Pure hypercholesterolemia, unspecified: Secondary | ICD-10-CM | POA: Diagnosis not present

## 2018-01-01 DIAGNOSIS — E559 Vitamin D deficiency, unspecified: Secondary | ICD-10-CM | POA: Diagnosis not present

## 2018-01-01 DIAGNOSIS — N183 Chronic kidney disease, stage 3 (moderate): Secondary | ICD-10-CM | POA: Diagnosis not present

## 2018-01-01 DIAGNOSIS — D518 Other vitamin B12 deficiency anemias: Secondary | ICD-10-CM | POA: Diagnosis not present

## 2018-01-01 DIAGNOSIS — R82998 Other abnormal findings in urine: Secondary | ICD-10-CM | POA: Diagnosis not present

## 2018-01-01 DIAGNOSIS — E538 Deficiency of other specified B group vitamins: Secondary | ICD-10-CM | POA: Diagnosis not present

## 2018-01-08 DIAGNOSIS — Z1389 Encounter for screening for other disorder: Secondary | ICD-10-CM | POA: Diagnosis not present

## 2018-01-08 DIAGNOSIS — Z23 Encounter for immunization: Secondary | ICD-10-CM | POA: Diagnosis not present

## 2018-01-08 DIAGNOSIS — E1136 Type 2 diabetes mellitus with diabetic cataract: Secondary | ICD-10-CM | POA: Diagnosis not present

## 2018-01-08 DIAGNOSIS — Z794 Long term (current) use of insulin: Secondary | ICD-10-CM | POA: Diagnosis not present

## 2018-01-08 DIAGNOSIS — E7800 Pure hypercholesterolemia, unspecified: Secondary | ICD-10-CM | POA: Diagnosis not present

## 2018-01-08 DIAGNOSIS — E1129 Type 2 diabetes mellitus with other diabetic kidney complication: Secondary | ICD-10-CM | POA: Diagnosis not present

## 2018-01-08 DIAGNOSIS — N183 Chronic kidney disease, stage 3 (moderate): Secondary | ICD-10-CM | POA: Diagnosis not present

## 2018-01-08 DIAGNOSIS — I129 Hypertensive chronic kidney disease with stage 1 through stage 4 chronic kidney disease, or unspecified chronic kidney disease: Secondary | ICD-10-CM | POA: Diagnosis not present

## 2018-01-08 DIAGNOSIS — Z6828 Body mass index (BMI) 28.0-28.9, adult: Secondary | ICD-10-CM | POA: Diagnosis not present

## 2018-01-08 DIAGNOSIS — Z0000 Encounter for general adult medical examination without abnormal findings: Secondary | ICD-10-CM | POA: Diagnosis not present

## 2018-01-08 DIAGNOSIS — M859 Disorder of bone density and structure, unspecified: Secondary | ICD-10-CM | POA: Diagnosis not present

## 2018-01-08 DIAGNOSIS — I10 Essential (primary) hypertension: Secondary | ICD-10-CM | POA: Diagnosis not present

## 2018-01-08 DIAGNOSIS — D692 Other nonthrombocytopenic purpura: Secondary | ICD-10-CM | POA: Diagnosis not present

## 2018-01-13 DIAGNOSIS — H0100A Unspecified blepharitis right eye, upper and lower eyelids: Secondary | ICD-10-CM | POA: Diagnosis not present

## 2018-01-13 DIAGNOSIS — H04123 Dry eye syndrome of bilateral lacrimal glands: Secondary | ICD-10-CM | POA: Diagnosis not present

## 2018-01-13 DIAGNOSIS — H0100B Unspecified blepharitis left eye, upper and lower eyelids: Secondary | ICD-10-CM | POA: Diagnosis not present

## 2018-01-15 DIAGNOSIS — Z1212 Encounter for screening for malignant neoplasm of rectum: Secondary | ICD-10-CM | POA: Diagnosis not present

## 2018-04-14 DIAGNOSIS — N183 Chronic kidney disease, stage 3 (moderate): Secondary | ICD-10-CM | POA: Diagnosis not present

## 2018-04-14 DIAGNOSIS — E1129 Type 2 diabetes mellitus with other diabetic kidney complication: Secondary | ICD-10-CM | POA: Diagnosis not present

## 2018-04-14 DIAGNOSIS — Z794 Long term (current) use of insulin: Secondary | ICD-10-CM | POA: Diagnosis not present

## 2018-04-14 DIAGNOSIS — I10 Essential (primary) hypertension: Secondary | ICD-10-CM | POA: Diagnosis not present

## 2018-06-17 DIAGNOSIS — H40013 Open angle with borderline findings, low risk, bilateral: Secondary | ICD-10-CM | POA: Diagnosis not present

## 2018-07-07 DIAGNOSIS — M179 Osteoarthritis of knee, unspecified: Secondary | ICD-10-CM | POA: Diagnosis not present

## 2018-07-07 DIAGNOSIS — I129 Hypertensive chronic kidney disease with stage 1 through stage 4 chronic kidney disease, or unspecified chronic kidney disease: Secondary | ICD-10-CM | POA: Diagnosis not present

## 2018-07-07 DIAGNOSIS — E1136 Type 2 diabetes mellitus with diabetic cataract: Secondary | ICD-10-CM | POA: Diagnosis not present

## 2018-07-07 DIAGNOSIS — E7800 Pure hypercholesterolemia, unspecified: Secondary | ICD-10-CM | POA: Diagnosis not present

## 2018-07-07 DIAGNOSIS — E1129 Type 2 diabetes mellitus with other diabetic kidney complication: Secondary | ICD-10-CM | POA: Diagnosis not present

## 2018-07-07 DIAGNOSIS — N183 Chronic kidney disease, stage 3 (moderate): Secondary | ICD-10-CM | POA: Diagnosis not present

## 2018-07-07 DIAGNOSIS — Z794 Long term (current) use of insulin: Secondary | ICD-10-CM | POA: Diagnosis not present

## 2018-07-07 DIAGNOSIS — M353 Polymyalgia rheumatica: Secondary | ICD-10-CM | POA: Diagnosis not present

## 2018-07-08 DIAGNOSIS — E1129 Type 2 diabetes mellitus with other diabetic kidney complication: Secondary | ICD-10-CM | POA: Diagnosis not present

## 2018-07-22 DIAGNOSIS — E1129 Type 2 diabetes mellitus with other diabetic kidney complication: Secondary | ICD-10-CM | POA: Diagnosis not present

## 2018-07-22 DIAGNOSIS — I129 Hypertensive chronic kidney disease with stage 1 through stage 4 chronic kidney disease, or unspecified chronic kidney disease: Secondary | ICD-10-CM | POA: Diagnosis not present

## 2018-07-22 DIAGNOSIS — Z794 Long term (current) use of insulin: Secondary | ICD-10-CM | POA: Diagnosis not present

## 2018-07-22 DIAGNOSIS — N183 Chronic kidney disease, stage 3 (moderate): Secondary | ICD-10-CM | POA: Diagnosis not present

## 2018-10-12 ENCOUNTER — Other Ambulatory Visit: Payer: Self-pay | Admitting: Internal Medicine

## 2018-10-12 DIAGNOSIS — D519 Vitamin B12 deficiency anemia, unspecified: Secondary | ICD-10-CM | POA: Diagnosis not present

## 2018-10-12 DIAGNOSIS — Z1231 Encounter for screening mammogram for malignant neoplasm of breast: Secondary | ICD-10-CM

## 2018-10-12 DIAGNOSIS — E1136 Type 2 diabetes mellitus with diabetic cataract: Secondary | ICD-10-CM | POA: Diagnosis not present

## 2018-10-12 DIAGNOSIS — I129 Hypertensive chronic kidney disease with stage 1 through stage 4 chronic kidney disease, or unspecified chronic kidney disease: Secondary | ICD-10-CM | POA: Diagnosis not present

## 2018-10-12 DIAGNOSIS — E1129 Type 2 diabetes mellitus with other diabetic kidney complication: Secondary | ICD-10-CM | POA: Diagnosis not present

## 2018-10-12 DIAGNOSIS — N1831 Chronic kidney disease, stage 3a: Secondary | ICD-10-CM | POA: Diagnosis not present

## 2018-10-12 DIAGNOSIS — E7800 Pure hypercholesterolemia, unspecified: Secondary | ICD-10-CM | POA: Diagnosis not present

## 2018-10-12 DIAGNOSIS — E559 Vitamin D deficiency, unspecified: Secondary | ICD-10-CM | POA: Diagnosis not present

## 2018-10-12 DIAGNOSIS — Z794 Long term (current) use of insulin: Secondary | ICD-10-CM | POA: Diagnosis not present

## 2018-10-15 DIAGNOSIS — E559 Vitamin D deficiency, unspecified: Secondary | ICD-10-CM | POA: Diagnosis not present

## 2018-10-15 DIAGNOSIS — Z23 Encounter for immunization: Secondary | ICD-10-CM | POA: Diagnosis not present

## 2018-10-15 DIAGNOSIS — D519 Vitamin B12 deficiency anemia, unspecified: Secondary | ICD-10-CM | POA: Diagnosis not present

## 2018-10-15 DIAGNOSIS — I10 Essential (primary) hypertension: Secondary | ICD-10-CM | POA: Diagnosis not present

## 2018-10-15 DIAGNOSIS — E1129 Type 2 diabetes mellitus with other diabetic kidney complication: Secondary | ICD-10-CM | POA: Diagnosis not present

## 2018-11-19 DIAGNOSIS — C44729 Squamous cell carcinoma of skin of left lower limb, including hip: Secondary | ICD-10-CM | POA: Diagnosis not present

## 2018-11-25 DIAGNOSIS — E1129 Type 2 diabetes mellitus with other diabetic kidney complication: Secondary | ICD-10-CM | POA: Diagnosis not present

## 2018-11-25 DIAGNOSIS — N1831 Chronic kidney disease, stage 3a: Secondary | ICD-10-CM | POA: Diagnosis not present

## 2018-11-25 DIAGNOSIS — Z794 Long term (current) use of insulin: Secondary | ICD-10-CM | POA: Diagnosis not present

## 2018-11-25 DIAGNOSIS — I129 Hypertensive chronic kidney disease with stage 1 through stage 4 chronic kidney disease, or unspecified chronic kidney disease: Secondary | ICD-10-CM | POA: Diagnosis not present

## 2018-11-26 ENCOUNTER — Other Ambulatory Visit: Payer: Self-pay

## 2018-11-26 ENCOUNTER — Ambulatory Visit
Admission: RE | Admit: 2018-11-26 | Discharge: 2018-11-26 | Disposition: A | Discharge status: Home / Self Care | Payer: Medicare Other | Source: Ambulatory Visit | Attending: Internal Medicine | Admitting: Internal Medicine

## 2018-11-26 DIAGNOSIS — Z1231 Encounter for screening mammogram for malignant neoplasm of breast: Secondary | ICD-10-CM | POA: Diagnosis not present

## 2018-11-30 DIAGNOSIS — C44729 Squamous cell carcinoma of skin of left lower limb, including hip: Secondary | ICD-10-CM | POA: Diagnosis not present

## 2018-12-15 DIAGNOSIS — H43813 Vitreous degeneration, bilateral: Secondary | ICD-10-CM | POA: Diagnosis not present

## 2018-12-15 DIAGNOSIS — H40013 Open angle with borderline findings, low risk, bilateral: Secondary | ICD-10-CM | POA: Diagnosis not present

## 2018-12-15 DIAGNOSIS — E119 Type 2 diabetes mellitus without complications: Secondary | ICD-10-CM | POA: Diagnosis not present

## 2018-12-15 DIAGNOSIS — H04123 Dry eye syndrome of bilateral lacrimal glands: Secondary | ICD-10-CM | POA: Diagnosis not present

## 2019-01-11 DIAGNOSIS — M353 Polymyalgia rheumatica: Secondary | ICD-10-CM | POA: Diagnosis not present

## 2019-01-11 DIAGNOSIS — E559 Vitamin D deficiency, unspecified: Secondary | ICD-10-CM | POA: Diagnosis not present

## 2019-01-11 DIAGNOSIS — Z0000 Encounter for general adult medical examination without abnormal findings: Secondary | ICD-10-CM | POA: Diagnosis not present

## 2019-01-11 DIAGNOSIS — M179 Osteoarthritis of knee, unspecified: Secondary | ICD-10-CM | POA: Diagnosis not present

## 2019-01-11 DIAGNOSIS — E1136 Type 2 diabetes mellitus with diabetic cataract: Secondary | ICD-10-CM | POA: Diagnosis not present

## 2019-01-11 DIAGNOSIS — Z794 Long term (current) use of insulin: Secondary | ICD-10-CM | POA: Diagnosis not present

## 2019-01-11 DIAGNOSIS — E1129 Type 2 diabetes mellitus with other diabetic kidney complication: Secondary | ICD-10-CM | POA: Diagnosis not present

## 2019-01-11 DIAGNOSIS — D519 Vitamin B12 deficiency anemia, unspecified: Secondary | ICD-10-CM | POA: Diagnosis not present

## 2019-01-11 DIAGNOSIS — M1990 Unspecified osteoarthritis, unspecified site: Secondary | ICD-10-CM | POA: Diagnosis not present

## 2019-01-11 DIAGNOSIS — E7800 Pure hypercholesterolemia, unspecified: Secondary | ICD-10-CM | POA: Diagnosis not present

## 2019-01-11 DIAGNOSIS — N1831 Chronic kidney disease, stage 3a: Secondary | ICD-10-CM | POA: Diagnosis not present

## 2019-01-11 DIAGNOSIS — I129 Hypertensive chronic kidney disease with stage 1 through stage 4 chronic kidney disease, or unspecified chronic kidney disease: Secondary | ICD-10-CM | POA: Diagnosis not present

## 2019-02-11 DIAGNOSIS — Z85828 Personal history of other malignant neoplasm of skin: Secondary | ICD-10-CM | POA: Diagnosis not present

## 2019-02-11 DIAGNOSIS — L814 Other melanin hyperpigmentation: Secondary | ICD-10-CM | POA: Diagnosis not present

## 2019-02-11 DIAGNOSIS — L570 Actinic keratosis: Secondary | ICD-10-CM | POA: Diagnosis not present

## 2019-03-04 DIAGNOSIS — I129 Hypertensive chronic kidney disease with stage 1 through stage 4 chronic kidney disease, or unspecified chronic kidney disease: Secondary | ICD-10-CM | POA: Diagnosis not present

## 2019-03-04 DIAGNOSIS — Z794 Long term (current) use of insulin: Secondary | ICD-10-CM | POA: Diagnosis not present

## 2019-03-04 DIAGNOSIS — N1831 Chronic kidney disease, stage 3a: Secondary | ICD-10-CM | POA: Diagnosis not present

## 2019-03-04 DIAGNOSIS — E1129 Type 2 diabetes mellitus with other diabetic kidney complication: Secondary | ICD-10-CM | POA: Diagnosis not present

## 2019-04-20 DIAGNOSIS — Z1331 Encounter for screening for depression: Secondary | ICD-10-CM | POA: Diagnosis not present

## 2019-04-20 DIAGNOSIS — E559 Vitamin D deficiency, unspecified: Secondary | ICD-10-CM | POA: Diagnosis not present

## 2019-04-20 DIAGNOSIS — I129 Hypertensive chronic kidney disease with stage 1 through stage 4 chronic kidney disease, or unspecified chronic kidney disease: Secondary | ICD-10-CM | POA: Diagnosis not present

## 2019-04-20 DIAGNOSIS — N1831 Chronic kidney disease, stage 3a: Secondary | ICD-10-CM | POA: Diagnosis not present

## 2019-04-20 DIAGNOSIS — L84 Corns and callosities: Secondary | ICD-10-CM | POA: Diagnosis not present

## 2019-04-20 DIAGNOSIS — E1129 Type 2 diabetes mellitus with other diabetic kidney complication: Secondary | ICD-10-CM | POA: Diagnosis not present

## 2019-04-20 DIAGNOSIS — E1136 Type 2 diabetes mellitus with diabetic cataract: Secondary | ICD-10-CM | POA: Diagnosis not present

## 2019-04-20 DIAGNOSIS — E7800 Pure hypercholesterolemia, unspecified: Secondary | ICD-10-CM | POA: Diagnosis not present

## 2019-04-20 DIAGNOSIS — Z794 Long term (current) use of insulin: Secondary | ICD-10-CM | POA: Diagnosis not present

## 2019-04-20 DIAGNOSIS — D519 Vitamin B12 deficiency anemia, unspecified: Secondary | ICD-10-CM | POA: Diagnosis not present

## 2019-04-20 DIAGNOSIS — E538 Deficiency of other specified B group vitamins: Secondary | ICD-10-CM | POA: Diagnosis not present

## 2019-05-25 DIAGNOSIS — L821 Other seborrheic keratosis: Secondary | ICD-10-CM | POA: Diagnosis not present

## 2019-05-25 DIAGNOSIS — Z85828 Personal history of other malignant neoplasm of skin: Secondary | ICD-10-CM | POA: Diagnosis not present

## 2019-05-25 DIAGNOSIS — C44729 Squamous cell carcinoma of skin of left lower limb, including hip: Secondary | ICD-10-CM | POA: Diagnosis not present

## 2019-05-25 DIAGNOSIS — D485 Neoplasm of uncertain behavior of skin: Secondary | ICD-10-CM | POA: Diagnosis not present

## 2019-05-26 DIAGNOSIS — Z96651 Presence of right artificial knee joint: Secondary | ICD-10-CM | POA: Diagnosis not present

## 2019-05-26 DIAGNOSIS — M25561 Pain in right knee: Secondary | ICD-10-CM | POA: Diagnosis not present

## 2019-06-21 DIAGNOSIS — H40013 Open angle with borderline findings, low risk, bilateral: Secondary | ICD-10-CM | POA: Diagnosis not present

## 2019-07-20 DIAGNOSIS — E1136 Type 2 diabetes mellitus with diabetic cataract: Secondary | ICD-10-CM | POA: Diagnosis not present

## 2019-07-20 DIAGNOSIS — N1831 Chronic kidney disease, stage 3a: Secondary | ICD-10-CM | POA: Diagnosis not present

## 2019-07-20 DIAGNOSIS — M353 Polymyalgia rheumatica: Secondary | ICD-10-CM | POA: Diagnosis not present

## 2019-07-20 DIAGNOSIS — E1129 Type 2 diabetes mellitus with other diabetic kidney complication: Secondary | ICD-10-CM | POA: Diagnosis not present

## 2019-07-20 DIAGNOSIS — E7800 Pure hypercholesterolemia, unspecified: Secondary | ICD-10-CM | POA: Diagnosis not present

## 2019-07-20 DIAGNOSIS — I129 Hypertensive chronic kidney disease with stage 1 through stage 4 chronic kidney disease, or unspecified chronic kidney disease: Secondary | ICD-10-CM | POA: Diagnosis not present

## 2019-07-20 DIAGNOSIS — H6122 Impacted cerumen, left ear: Secondary | ICD-10-CM | POA: Diagnosis not present

## 2019-07-20 DIAGNOSIS — Z794 Long term (current) use of insulin: Secondary | ICD-10-CM | POA: Diagnosis not present

## 2019-07-20 DIAGNOSIS — M1990 Unspecified osteoarthritis, unspecified site: Secondary | ICD-10-CM | POA: Diagnosis not present

## 2019-08-30 DIAGNOSIS — E119 Type 2 diabetes mellitus without complications: Secondary | ICD-10-CM | POA: Diagnosis not present

## 2019-08-30 DIAGNOSIS — H524 Presbyopia: Secondary | ICD-10-CM | POA: Diagnosis not present

## 2019-08-30 DIAGNOSIS — Z961 Presence of intraocular lens: Secondary | ICD-10-CM | POA: Diagnosis not present

## 2019-08-30 DIAGNOSIS — H40013 Open angle with borderline findings, low risk, bilateral: Secondary | ICD-10-CM | POA: Diagnosis not present

## 2019-09-30 DIAGNOSIS — E1129 Type 2 diabetes mellitus with other diabetic kidney complication: Secondary | ICD-10-CM | POA: Diagnosis not present

## 2019-09-30 DIAGNOSIS — Z23 Encounter for immunization: Secondary | ICD-10-CM | POA: Diagnosis not present

## 2019-09-30 DIAGNOSIS — Z794 Long term (current) use of insulin: Secondary | ICD-10-CM | POA: Diagnosis not present

## 2019-09-30 DIAGNOSIS — N1831 Chronic kidney disease, stage 3a: Secondary | ICD-10-CM | POA: Diagnosis not present

## 2019-09-30 DIAGNOSIS — I129 Hypertensive chronic kidney disease with stage 1 through stage 4 chronic kidney disease, or unspecified chronic kidney disease: Secondary | ICD-10-CM | POA: Diagnosis not present

## 2019-10-20 ENCOUNTER — Other Ambulatory Visit: Payer: Self-pay | Admitting: Internal Medicine

## 2019-10-20 DIAGNOSIS — Z1231 Encounter for screening mammogram for malignant neoplasm of breast: Secondary | ICD-10-CM

## 2019-11-30 ENCOUNTER — Ambulatory Visit: Payer: Medicare Other

## 2020-01-05 DIAGNOSIS — E1129 Type 2 diabetes mellitus with other diabetic kidney complication: Secondary | ICD-10-CM | POA: Diagnosis not present

## 2020-01-05 DIAGNOSIS — E559 Vitamin D deficiency, unspecified: Secondary | ICD-10-CM | POA: Diagnosis not present

## 2020-01-05 DIAGNOSIS — D519 Vitamin B12 deficiency anemia, unspecified: Secondary | ICD-10-CM | POA: Diagnosis not present

## 2020-01-05 DIAGNOSIS — E7800 Pure hypercholesterolemia, unspecified: Secondary | ICD-10-CM | POA: Diagnosis not present

## 2020-01-12 DIAGNOSIS — M179 Osteoarthritis of knee, unspecified: Secondary | ICD-10-CM | POA: Diagnosis not present

## 2020-01-12 DIAGNOSIS — Z0000 Encounter for general adult medical examination without abnormal findings: Secondary | ICD-10-CM | POA: Diagnosis not present

## 2020-01-12 DIAGNOSIS — E7800 Pure hypercholesterolemia, unspecified: Secondary | ICD-10-CM | POA: Diagnosis not present

## 2020-01-12 DIAGNOSIS — N1831 Chronic kidney disease, stage 3a: Secondary | ICD-10-CM | POA: Diagnosis not present

## 2020-01-12 DIAGNOSIS — I129 Hypertensive chronic kidney disease with stage 1 through stage 4 chronic kidney disease, or unspecified chronic kidney disease: Secondary | ICD-10-CM | POA: Diagnosis not present

## 2020-01-12 DIAGNOSIS — Z794 Long term (current) use of insulin: Secondary | ICD-10-CM | POA: Diagnosis not present

## 2020-01-12 DIAGNOSIS — M353 Polymyalgia rheumatica: Secondary | ICD-10-CM | POA: Diagnosis not present

## 2020-01-12 DIAGNOSIS — E1129 Type 2 diabetes mellitus with other diabetic kidney complication: Secondary | ICD-10-CM | POA: Diagnosis not present

## 2020-01-12 DIAGNOSIS — E663 Overweight: Secondary | ICD-10-CM | POA: Diagnosis not present

## 2020-01-12 DIAGNOSIS — E1136 Type 2 diabetes mellitus with diabetic cataract: Secondary | ICD-10-CM | POA: Diagnosis not present

## 2020-01-12 DIAGNOSIS — M1990 Unspecified osteoarthritis, unspecified site: Secondary | ICD-10-CM | POA: Diagnosis not present

## 2020-01-12 DIAGNOSIS — R82998 Other abnormal findings in urine: Secondary | ICD-10-CM | POA: Diagnosis not present

## 2020-01-12 DIAGNOSIS — M8580 Other specified disorders of bone density and structure, unspecified site: Secondary | ICD-10-CM | POA: Diagnosis not present

## 2020-01-12 DIAGNOSIS — I10 Essential (primary) hypertension: Secondary | ICD-10-CM | POA: Diagnosis not present

## 2020-02-16 DIAGNOSIS — Z85828 Personal history of other malignant neoplasm of skin: Secondary | ICD-10-CM | POA: Diagnosis not present

## 2020-02-16 DIAGNOSIS — L821 Other seborrheic keratosis: Secondary | ICD-10-CM | POA: Diagnosis not present

## 2020-02-16 DIAGNOSIS — D2262 Melanocytic nevi of left upper limb, including shoulder: Secondary | ICD-10-CM | POA: Diagnosis not present

## 2020-02-16 DIAGNOSIS — D225 Melanocytic nevi of trunk: Secondary | ICD-10-CM | POA: Diagnosis not present

## 2020-02-16 DIAGNOSIS — L570 Actinic keratosis: Secondary | ICD-10-CM | POA: Diagnosis not present

## 2020-02-16 DIAGNOSIS — D2261 Melanocytic nevi of right upper limb, including shoulder: Secondary | ICD-10-CM | POA: Diagnosis not present

## 2020-02-23 DIAGNOSIS — E1129 Type 2 diabetes mellitus with other diabetic kidney complication: Secondary | ICD-10-CM | POA: Diagnosis not present

## 2020-02-23 DIAGNOSIS — N1831 Chronic kidney disease, stage 3a: Secondary | ICD-10-CM | POA: Diagnosis not present

## 2020-02-23 DIAGNOSIS — Z794 Long term (current) use of insulin: Secondary | ICD-10-CM | POA: Diagnosis not present

## 2020-02-23 DIAGNOSIS — I129 Hypertensive chronic kidney disease with stage 1 through stage 4 chronic kidney disease, or unspecified chronic kidney disease: Secondary | ICD-10-CM | POA: Diagnosis not present

## 2020-03-01 DIAGNOSIS — H401131 Primary open-angle glaucoma, bilateral, mild stage: Secondary | ICD-10-CM | POA: Diagnosis not present

## 2020-03-10 IMAGING — MG DIGITAL SCREENING BILAT W/ TOMO W/ CAD
8 series · 8 of 24 positions shown · non-contrast
Comparison: Previous exam(s).

CLINICAL DATA: Screening.

EXAM:
DIGITAL SCREENING BILATERAL MAMMOGRAM WITH TOMO AND CAD

[R MLO synth-2D]
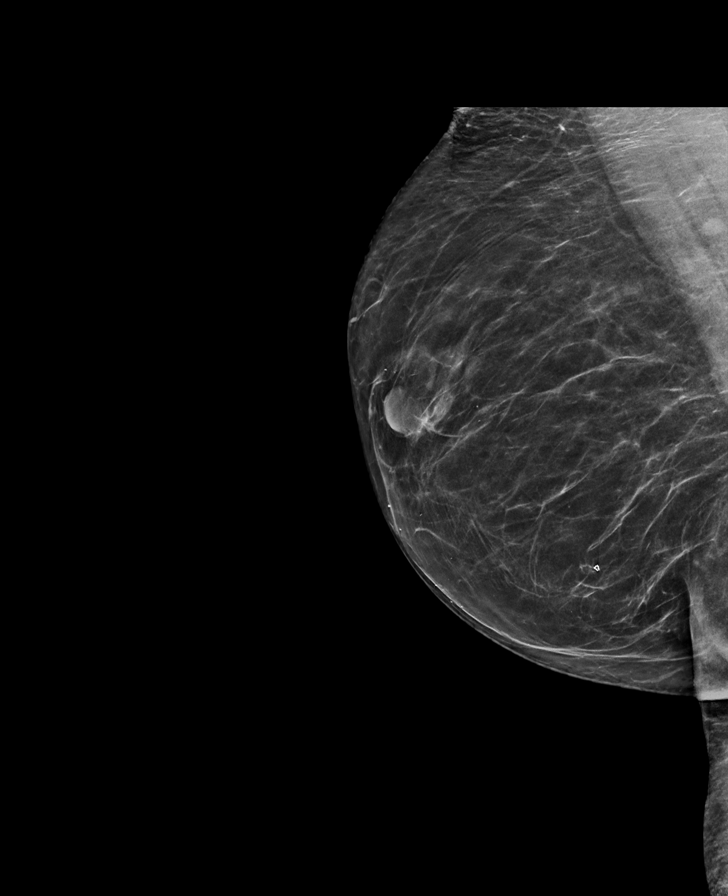

[R CC synth-2D]
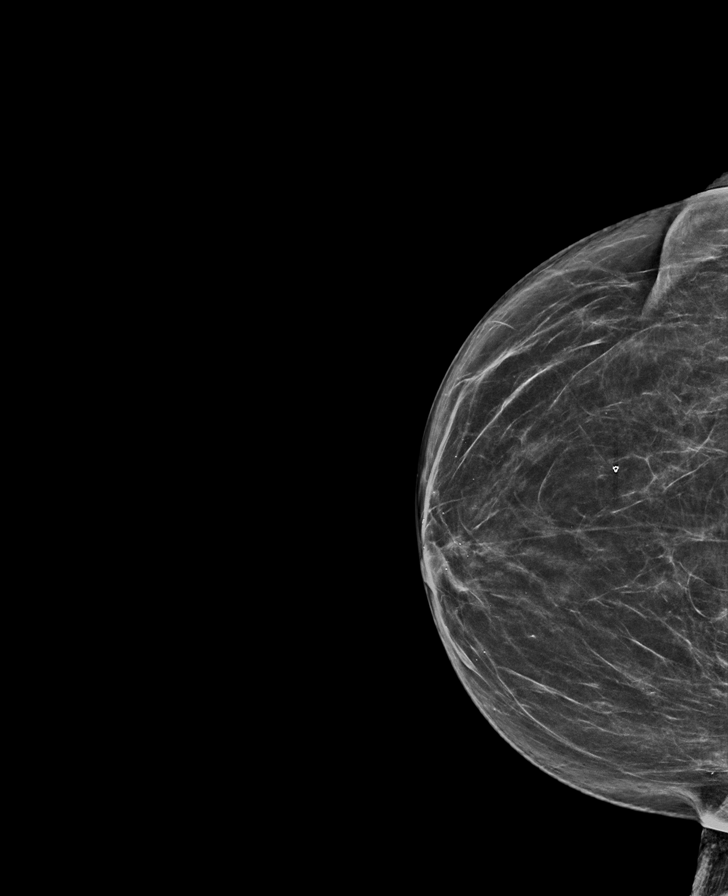

[L MLO synth-2D]
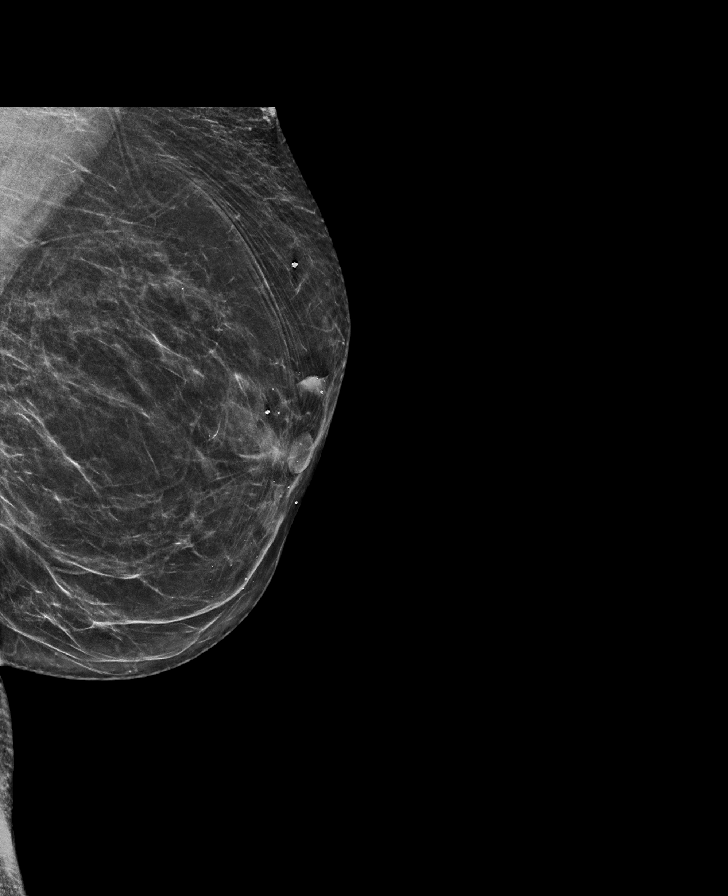

[L CC synth-2D]
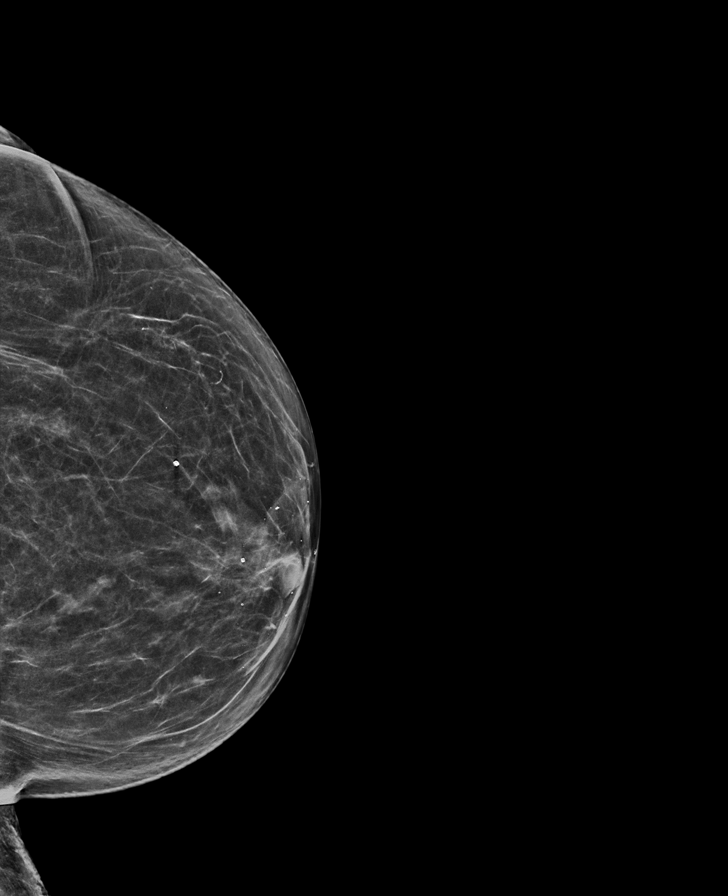

[R MLO tomo · tomo slice 33/66.0]
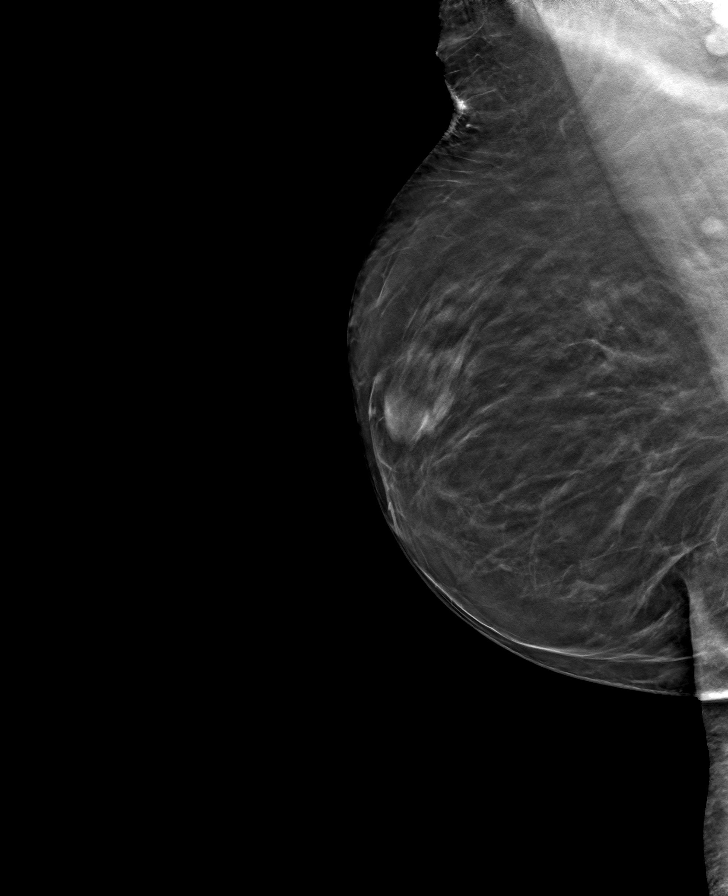

[L CC tomo · tomo slice 31/61.0]
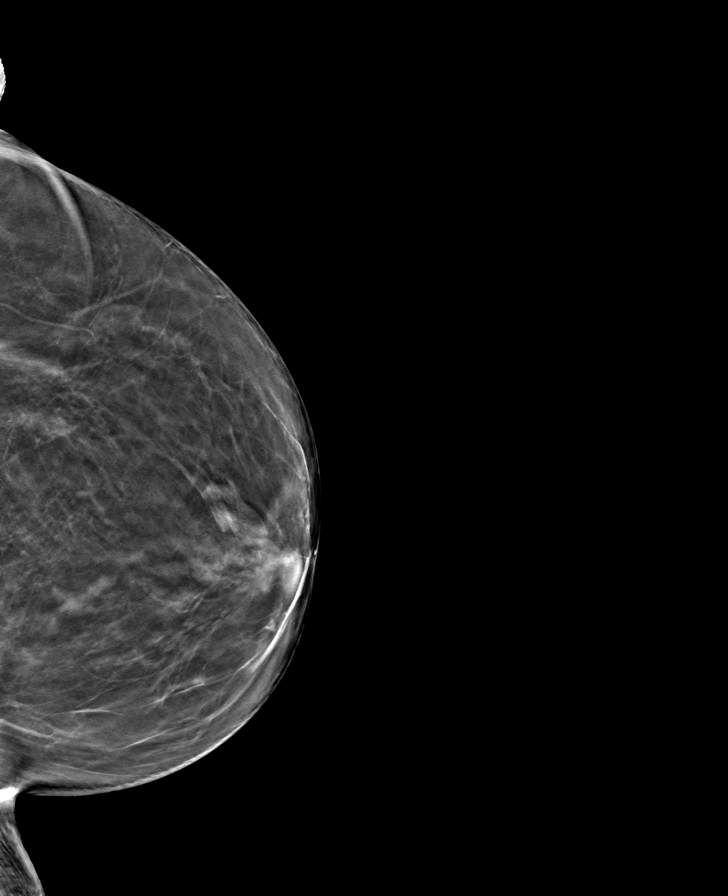

[R CC tomo · tomo slice 33/66.0]
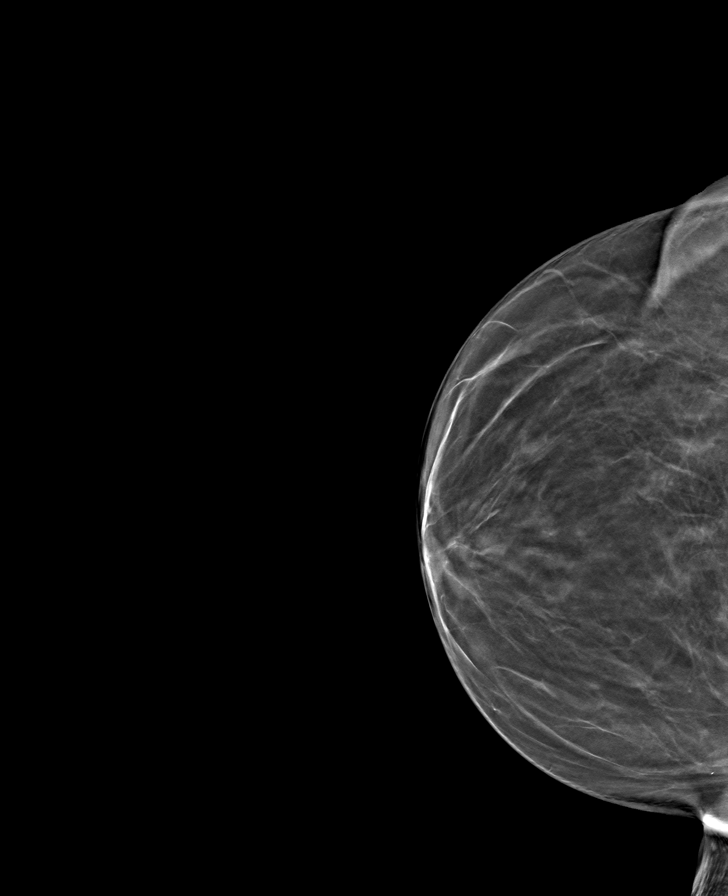

[L MLO tomo · tomo slice 32/63.0]
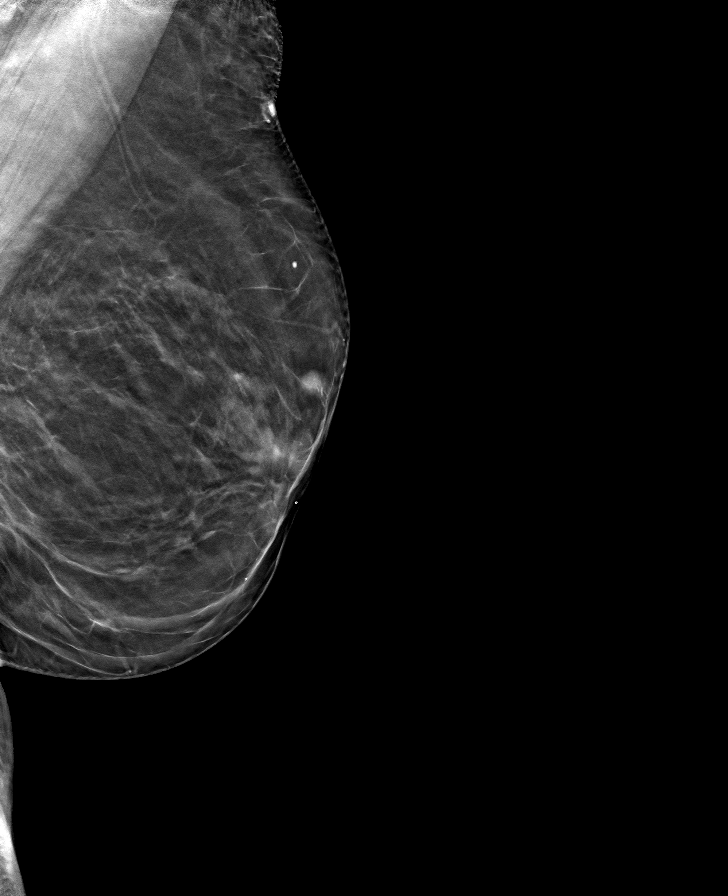

[8 of 24 positions shown; findings below may reference images not displayed]

ACR Breast Density Category b: There are scattered areas of
fibroglandular density.
FINDINGS: There are no findings suspicious for malignancy. Images were
processed with CAD.
IMPRESSION: No mammographic evidence of malignancy. A result letter of this
screening mammogram will be mailed directly to the patient.

RECOMMENDATION:
Screening mammogram in one year. (Code:[id number])

BI-RADS CATEGORY  1: Negative.

## 2020-03-22 DIAGNOSIS — H9041 Sensorineural hearing loss, unilateral, right ear, with unrestricted hearing on the contralateral side: Secondary | ICD-10-CM | POA: Diagnosis not present

## 2020-04-10 ENCOUNTER — Inpatient Hospital Stay: Admission: RE | Admit: 2020-04-10 | Payer: Medicare Other | Source: Ambulatory Visit

## 2020-05-11 DIAGNOSIS — E1136 Type 2 diabetes mellitus with diabetic cataract: Secondary | ICD-10-CM | POA: Diagnosis not present

## 2020-05-11 DIAGNOSIS — N1831 Chronic kidney disease, stage 3a: Secondary | ICD-10-CM | POA: Diagnosis not present

## 2020-05-11 DIAGNOSIS — M179 Osteoarthritis of knee, unspecified: Secondary | ICD-10-CM | POA: Diagnosis not present

## 2020-05-11 DIAGNOSIS — E1129 Type 2 diabetes mellitus with other diabetic kidney complication: Secondary | ICD-10-CM | POA: Diagnosis not present

## 2020-05-11 DIAGNOSIS — R251 Tremor, unspecified: Secondary | ICD-10-CM | POA: Diagnosis not present

## 2020-05-11 DIAGNOSIS — I129 Hypertensive chronic kidney disease with stage 1 through stage 4 chronic kidney disease, or unspecified chronic kidney disease: Secondary | ICD-10-CM | POA: Diagnosis not present

## 2020-05-11 DIAGNOSIS — E663 Overweight: Secondary | ICD-10-CM | POA: Diagnosis not present

## 2020-05-11 DIAGNOSIS — H9190 Unspecified hearing loss, unspecified ear: Secondary | ICD-10-CM | POA: Diagnosis not present

## 2020-05-11 DIAGNOSIS — E7800 Pure hypercholesterolemia, unspecified: Secondary | ICD-10-CM | POA: Diagnosis not present

## 2020-05-11 DIAGNOSIS — R413 Other amnesia: Secondary | ICD-10-CM | POA: Diagnosis not present

## 2020-05-11 DIAGNOSIS — Z794 Long term (current) use of insulin: Secondary | ICD-10-CM | POA: Diagnosis not present

## 2020-05-11 DIAGNOSIS — D692 Other nonthrombocytopenic purpura: Secondary | ICD-10-CM | POA: Diagnosis not present

## 2020-06-22 DIAGNOSIS — E7800 Pure hypercholesterolemia, unspecified: Secondary | ICD-10-CM | POA: Diagnosis not present

## 2020-06-22 DIAGNOSIS — Z794 Long term (current) use of insulin: Secondary | ICD-10-CM | POA: Diagnosis not present

## 2020-06-22 DIAGNOSIS — N1831 Chronic kidney disease, stage 3a: Secondary | ICD-10-CM | POA: Diagnosis not present

## 2020-06-22 DIAGNOSIS — I129 Hypertensive chronic kidney disease with stage 1 through stage 4 chronic kidney disease, or unspecified chronic kidney disease: Secondary | ICD-10-CM | POA: Diagnosis not present

## 2020-06-22 DIAGNOSIS — E1129 Type 2 diabetes mellitus with other diabetic kidney complication: Secondary | ICD-10-CM | POA: Diagnosis not present

## 2020-08-08 DIAGNOSIS — E7800 Pure hypercholesterolemia, unspecified: Secondary | ICD-10-CM | POA: Diagnosis not present

## 2020-08-08 DIAGNOSIS — Z794 Long term (current) use of insulin: Secondary | ICD-10-CM | POA: Diagnosis not present

## 2020-08-08 DIAGNOSIS — I129 Hypertensive chronic kidney disease with stage 1 through stage 4 chronic kidney disease, or unspecified chronic kidney disease: Secondary | ICD-10-CM | POA: Diagnosis not present

## 2020-08-08 DIAGNOSIS — N1831 Chronic kidney disease, stage 3a: Secondary | ICD-10-CM | POA: Diagnosis not present

## 2020-08-08 DIAGNOSIS — M179 Osteoarthritis of knee, unspecified: Secondary | ICD-10-CM | POA: Diagnosis not present

## 2020-08-08 DIAGNOSIS — R251 Tremor, unspecified: Secondary | ICD-10-CM | POA: Diagnosis not present

## 2020-08-08 DIAGNOSIS — R413 Other amnesia: Secondary | ICD-10-CM | POA: Diagnosis not present

## 2020-08-08 DIAGNOSIS — H9190 Unspecified hearing loss, unspecified ear: Secondary | ICD-10-CM | POA: Diagnosis not present

## 2020-08-08 DIAGNOSIS — E663 Overweight: Secondary | ICD-10-CM | POA: Diagnosis not present

## 2020-08-08 DIAGNOSIS — E1129 Type 2 diabetes mellitus with other diabetic kidney complication: Secondary | ICD-10-CM | POA: Diagnosis not present

## 2020-08-08 DIAGNOSIS — D692 Other nonthrombocytopenic purpura: Secondary | ICD-10-CM | POA: Diagnosis not present

## 2020-08-08 DIAGNOSIS — E1136 Type 2 diabetes mellitus with diabetic cataract: Secondary | ICD-10-CM | POA: Diagnosis not present

## 2020-08-29 DIAGNOSIS — H40023 Open angle with borderline findings, high risk, bilateral: Secondary | ICD-10-CM | POA: Diagnosis not present

## 2020-08-29 DIAGNOSIS — H524 Presbyopia: Secondary | ICD-10-CM | POA: Diagnosis not present

## 2020-08-29 DIAGNOSIS — E119 Type 2 diabetes mellitus without complications: Secondary | ICD-10-CM | POA: Diagnosis not present

## 2020-08-29 DIAGNOSIS — H26492 Other secondary cataract, left eye: Secondary | ICD-10-CM | POA: Diagnosis not present

## 2020-08-30 ENCOUNTER — Ambulatory Visit: Payer: Medicare Other | Admitting: Diagnostic Neuroimaging

## 2020-10-12 DIAGNOSIS — E1129 Type 2 diabetes mellitus with other diabetic kidney complication: Secondary | ICD-10-CM | POA: Diagnosis not present

## 2020-10-12 DIAGNOSIS — Z23 Encounter for immunization: Secondary | ICD-10-CM | POA: Diagnosis not present

## 2020-10-12 DIAGNOSIS — I129 Hypertensive chronic kidney disease with stage 1 through stage 4 chronic kidney disease, or unspecified chronic kidney disease: Secondary | ICD-10-CM | POA: Diagnosis not present

## 2020-10-12 DIAGNOSIS — Z794 Long term (current) use of insulin: Secondary | ICD-10-CM | POA: Diagnosis not present

## 2020-10-12 DIAGNOSIS — N1831 Chronic kidney disease, stage 3a: Secondary | ICD-10-CM | POA: Diagnosis not present

## 2020-10-12 DIAGNOSIS — E7800 Pure hypercholesterolemia, unspecified: Secondary | ICD-10-CM | POA: Diagnosis not present

## 2021-02-15 DIAGNOSIS — L304 Erythema intertrigo: Secondary | ICD-10-CM | POA: Diagnosis not present

## 2021-02-15 DIAGNOSIS — L821 Other seborrheic keratosis: Secondary | ICD-10-CM | POA: Diagnosis not present

## 2021-02-15 DIAGNOSIS — D225 Melanocytic nevi of trunk: Secondary | ICD-10-CM | POA: Diagnosis not present

## 2021-02-15 DIAGNOSIS — L814 Other melanin hyperpigmentation: Secondary | ICD-10-CM | POA: Diagnosis not present

## 2021-02-15 DIAGNOSIS — Z85828 Personal history of other malignant neoplasm of skin: Secondary | ICD-10-CM | POA: Diagnosis not present

## 2021-02-16 DIAGNOSIS — E559 Vitamin D deficiency, unspecified: Secondary | ICD-10-CM | POA: Diagnosis not present

## 2021-02-16 DIAGNOSIS — E7800 Pure hypercholesterolemia, unspecified: Secondary | ICD-10-CM | POA: Diagnosis not present

## 2021-02-16 DIAGNOSIS — D519 Vitamin B12 deficiency anemia, unspecified: Secondary | ICD-10-CM | POA: Diagnosis not present

## 2021-02-16 DIAGNOSIS — E1129 Type 2 diabetes mellitus with other diabetic kidney complication: Secondary | ICD-10-CM | POA: Diagnosis not present

## 2021-02-23 DIAGNOSIS — E1129 Type 2 diabetes mellitus with other diabetic kidney complication: Secondary | ICD-10-CM | POA: Diagnosis not present

## 2021-02-23 DIAGNOSIS — E7800 Pure hypercholesterolemia, unspecified: Secondary | ICD-10-CM | POA: Diagnosis not present

## 2021-02-23 DIAGNOSIS — Z1339 Encounter for screening examination for other mental health and behavioral disorders: Secondary | ICD-10-CM | POA: Diagnosis not present

## 2021-02-23 DIAGNOSIS — M179 Osteoarthritis of knee, unspecified: Secondary | ICD-10-CM | POA: Diagnosis not present

## 2021-02-23 DIAGNOSIS — Z794 Long term (current) use of insulin: Secondary | ICD-10-CM | POA: Diagnosis not present

## 2021-02-23 DIAGNOSIS — I129 Hypertensive chronic kidney disease with stage 1 through stage 4 chronic kidney disease, or unspecified chronic kidney disease: Secondary | ICD-10-CM | POA: Diagnosis not present

## 2021-02-23 DIAGNOSIS — R413 Other amnesia: Secondary | ICD-10-CM | POA: Diagnosis not present

## 2021-02-23 DIAGNOSIS — E663 Overweight: Secondary | ICD-10-CM | POA: Diagnosis not present

## 2021-02-23 DIAGNOSIS — N1831 Chronic kidney disease, stage 3a: Secondary | ICD-10-CM | POA: Diagnosis not present

## 2021-02-23 DIAGNOSIS — M8580 Other specified disorders of bone density and structure, unspecified site: Secondary | ICD-10-CM | POA: Diagnosis not present

## 2021-02-23 DIAGNOSIS — D692 Other nonthrombocytopenic purpura: Secondary | ICD-10-CM | POA: Diagnosis not present

## 2021-02-23 DIAGNOSIS — Z0000 Encounter for general adult medical examination without abnormal findings: Secondary | ICD-10-CM | POA: Diagnosis not present

## 2021-02-23 DIAGNOSIS — Z1331 Encounter for screening for depression: Secondary | ICD-10-CM | POA: Diagnosis not present

## 2021-02-23 DIAGNOSIS — R82998 Other abnormal findings in urine: Secondary | ICD-10-CM | POA: Diagnosis not present

## 2021-02-27 DIAGNOSIS — H40023 Open angle with borderline findings, high risk, bilateral: Secondary | ICD-10-CM | POA: Diagnosis not present

## 2021-04-05 DIAGNOSIS — E1129 Type 2 diabetes mellitus with other diabetic kidney complication: Secondary | ICD-10-CM | POA: Diagnosis not present

## 2021-04-05 DIAGNOSIS — E7800 Pure hypercholesterolemia, unspecified: Secondary | ICD-10-CM | POA: Diagnosis not present

## 2021-04-05 DIAGNOSIS — I129 Hypertensive chronic kidney disease with stage 1 through stage 4 chronic kidney disease, or unspecified chronic kidney disease: Secondary | ICD-10-CM | POA: Diagnosis not present

## 2021-04-05 DIAGNOSIS — Z794 Long term (current) use of insulin: Secondary | ICD-10-CM | POA: Diagnosis not present

## 2021-04-05 DIAGNOSIS — N1831 Chronic kidney disease, stage 3a: Secondary | ICD-10-CM | POA: Diagnosis not present

## 2021-08-23 DIAGNOSIS — E1129 Type 2 diabetes mellitus with other diabetic kidney complication: Secondary | ICD-10-CM | POA: Diagnosis not present

## 2021-08-23 DIAGNOSIS — I129 Hypertensive chronic kidney disease with stage 1 through stage 4 chronic kidney disease, or unspecified chronic kidney disease: Secondary | ICD-10-CM | POA: Diagnosis not present

## 2021-08-23 DIAGNOSIS — M353 Polymyalgia rheumatica: Secondary | ICD-10-CM | POA: Diagnosis not present

## 2021-08-23 DIAGNOSIS — Z794 Long term (current) use of insulin: Secondary | ICD-10-CM | POA: Diagnosis not present

## 2021-08-23 DIAGNOSIS — E663 Overweight: Secondary | ICD-10-CM | POA: Diagnosis not present

## 2021-08-23 DIAGNOSIS — N1831 Chronic kidney disease, stage 3a: Secondary | ICD-10-CM | POA: Diagnosis not present

## 2021-08-23 DIAGNOSIS — R413 Other amnesia: Secondary | ICD-10-CM | POA: Diagnosis not present

## 2021-08-23 DIAGNOSIS — M8580 Other specified disorders of bone density and structure, unspecified site: Secondary | ICD-10-CM | POA: Diagnosis not present

## 2021-08-23 DIAGNOSIS — E7800 Pure hypercholesterolemia, unspecified: Secondary | ICD-10-CM | POA: Diagnosis not present

## 2021-08-23 DIAGNOSIS — M179 Osteoarthritis of knee, unspecified: Secondary | ICD-10-CM | POA: Diagnosis not present

## 2021-08-23 DIAGNOSIS — D692 Other nonthrombocytopenic purpura: Secondary | ICD-10-CM | POA: Diagnosis not present

## 2021-08-23 DIAGNOSIS — H9190 Unspecified hearing loss, unspecified ear: Secondary | ICD-10-CM | POA: Diagnosis not present

## 2021-08-24 DIAGNOSIS — H26492 Other secondary cataract, left eye: Secondary | ICD-10-CM | POA: Diagnosis not present

## 2021-08-24 DIAGNOSIS — H40023 Open angle with borderline findings, high risk, bilateral: Secondary | ICD-10-CM | POA: Diagnosis not present

## 2021-08-24 DIAGNOSIS — E119 Type 2 diabetes mellitus without complications: Secondary | ICD-10-CM | POA: Diagnosis not present

## 2021-08-24 DIAGNOSIS — H524 Presbyopia: Secondary | ICD-10-CM | POA: Diagnosis not present

## 2021-10-09 DIAGNOSIS — N1831 Chronic kidney disease, stage 3a: Secondary | ICD-10-CM | POA: Diagnosis not present

## 2021-10-09 DIAGNOSIS — Z23 Encounter for immunization: Secondary | ICD-10-CM | POA: Diagnosis not present

## 2021-10-09 DIAGNOSIS — Z794 Long term (current) use of insulin: Secondary | ICD-10-CM | POA: Diagnosis not present

## 2021-10-09 DIAGNOSIS — E1129 Type 2 diabetes mellitus with other diabetic kidney complication: Secondary | ICD-10-CM | POA: Diagnosis not present

## 2021-10-09 DIAGNOSIS — E7800 Pure hypercholesterolemia, unspecified: Secondary | ICD-10-CM | POA: Diagnosis not present

## 2021-10-09 DIAGNOSIS — I129 Hypertensive chronic kidney disease with stage 1 through stage 4 chronic kidney disease, or unspecified chronic kidney disease: Secondary | ICD-10-CM | POA: Diagnosis not present

## 2022-01-16 DIAGNOSIS — E7800 Pure hypercholesterolemia, unspecified: Secondary | ICD-10-CM | POA: Diagnosis not present

## 2022-01-16 DIAGNOSIS — I129 Hypertensive chronic kidney disease with stage 1 through stage 4 chronic kidney disease, or unspecified chronic kidney disease: Secondary | ICD-10-CM | POA: Diagnosis not present

## 2022-01-16 DIAGNOSIS — N1831 Chronic kidney disease, stage 3a: Secondary | ICD-10-CM | POA: Diagnosis not present

## 2022-01-16 DIAGNOSIS — Z794 Long term (current) use of insulin: Secondary | ICD-10-CM | POA: Diagnosis not present

## 2022-01-16 DIAGNOSIS — E1129 Type 2 diabetes mellitus with other diabetic kidney complication: Secondary | ICD-10-CM | POA: Diagnosis not present

## 2022-02-22 DIAGNOSIS — E7800 Pure hypercholesterolemia, unspecified: Secondary | ICD-10-CM | POA: Diagnosis not present

## 2022-02-22 DIAGNOSIS — D519 Vitamin B12 deficiency anemia, unspecified: Secondary | ICD-10-CM | POA: Diagnosis not present

## 2022-02-22 DIAGNOSIS — R7989 Other specified abnormal findings of blood chemistry: Secondary | ICD-10-CM | POA: Diagnosis not present

## 2022-02-22 DIAGNOSIS — E559 Vitamin D deficiency, unspecified: Secondary | ICD-10-CM | POA: Diagnosis not present

## 2022-02-22 DIAGNOSIS — E1129 Type 2 diabetes mellitus with other diabetic kidney complication: Secondary | ICD-10-CM | POA: Diagnosis not present

## 2022-02-26 DIAGNOSIS — L820 Inflamed seborrheic keratosis: Secondary | ICD-10-CM | POA: Diagnosis not present

## 2022-02-26 DIAGNOSIS — L853 Xerosis cutis: Secondary | ICD-10-CM | POA: Diagnosis not present

## 2022-02-26 DIAGNOSIS — Z85828 Personal history of other malignant neoplasm of skin: Secondary | ICD-10-CM | POA: Diagnosis not present

## 2022-02-26 DIAGNOSIS — L814 Other melanin hyperpigmentation: Secondary | ICD-10-CM | POA: Diagnosis not present

## 2022-02-26 DIAGNOSIS — D225 Melanocytic nevi of trunk: Secondary | ICD-10-CM | POA: Diagnosis not present

## 2022-02-26 DIAGNOSIS — L821 Other seborrheic keratosis: Secondary | ICD-10-CM | POA: Diagnosis not present

## 2022-03-01 DIAGNOSIS — I129 Hypertensive chronic kidney disease with stage 1 through stage 4 chronic kidney disease, or unspecified chronic kidney disease: Secondary | ICD-10-CM | POA: Diagnosis not present

## 2022-03-01 DIAGNOSIS — R82998 Other abnormal findings in urine: Secondary | ICD-10-CM | POA: Diagnosis not present

## 2022-03-01 DIAGNOSIS — H9190 Unspecified hearing loss, unspecified ear: Secondary | ICD-10-CM | POA: Diagnosis not present

## 2022-03-01 DIAGNOSIS — E1129 Type 2 diabetes mellitus with other diabetic kidney complication: Secondary | ICD-10-CM | POA: Diagnosis not present

## 2022-03-01 DIAGNOSIS — Z1331 Encounter for screening for depression: Secondary | ICD-10-CM | POA: Diagnosis not present

## 2022-03-01 DIAGNOSIS — M8580 Other specified disorders of bone density and structure, unspecified site: Secondary | ICD-10-CM | POA: Diagnosis not present

## 2022-03-01 DIAGNOSIS — Z1339 Encounter for screening examination for other mental health and behavioral disorders: Secondary | ICD-10-CM | POA: Diagnosis not present

## 2022-03-01 DIAGNOSIS — L84 Corns and callosities: Secondary | ICD-10-CM | POA: Diagnosis not present

## 2022-03-01 DIAGNOSIS — N1831 Chronic kidney disease, stage 3a: Secondary | ICD-10-CM | POA: Diagnosis not present

## 2022-03-01 DIAGNOSIS — M179 Osteoarthritis of knee, unspecified: Secondary | ICD-10-CM | POA: Diagnosis not present

## 2022-03-01 DIAGNOSIS — E663 Overweight: Secondary | ICD-10-CM | POA: Diagnosis not present

## 2022-03-01 DIAGNOSIS — Z0000 Encounter for general adult medical examination without abnormal findings: Secondary | ICD-10-CM | POA: Diagnosis not present

## 2022-03-01 DIAGNOSIS — E7800 Pure hypercholesterolemia, unspecified: Secondary | ICD-10-CM | POA: Diagnosis not present

## 2022-03-05 DIAGNOSIS — H40013 Open angle with borderline findings, low risk, bilateral: Secondary | ICD-10-CM | POA: Diagnosis not present

## 2022-04-17 DIAGNOSIS — I129 Hypertensive chronic kidney disease with stage 1 through stage 4 chronic kidney disease, or unspecified chronic kidney disease: Secondary | ICD-10-CM | POA: Diagnosis not present

## 2022-04-17 DIAGNOSIS — E7800 Pure hypercholesterolemia, unspecified: Secondary | ICD-10-CM | POA: Diagnosis not present

## 2022-04-17 DIAGNOSIS — E1129 Type 2 diabetes mellitus with other diabetic kidney complication: Secondary | ICD-10-CM | POA: Diagnosis not present

## 2022-04-17 DIAGNOSIS — N1831 Chronic kidney disease, stage 3a: Secondary | ICD-10-CM | POA: Diagnosis not present

## 2022-04-17 DIAGNOSIS — Z794 Long term (current) use of insulin: Secondary | ICD-10-CM | POA: Diagnosis not present

## 2022-07-25 DIAGNOSIS — N1831 Chronic kidney disease, stage 3a: Secondary | ICD-10-CM | POA: Diagnosis not present

## 2022-07-25 DIAGNOSIS — E7800 Pure hypercholesterolemia, unspecified: Secondary | ICD-10-CM | POA: Diagnosis not present

## 2022-07-25 DIAGNOSIS — E1129 Type 2 diabetes mellitus with other diabetic kidney complication: Secondary | ICD-10-CM | POA: Diagnosis not present

## 2022-07-25 DIAGNOSIS — Z794 Long term (current) use of insulin: Secondary | ICD-10-CM | POA: Diagnosis not present

## 2022-07-25 DIAGNOSIS — I129 Hypertensive chronic kidney disease with stage 1 through stage 4 chronic kidney disease, or unspecified chronic kidney disease: Secondary | ICD-10-CM | POA: Diagnosis not present

## 2022-08-19 DIAGNOSIS — S0993XA Unspecified injury of face, initial encounter: Secondary | ICD-10-CM | POA: Diagnosis not present

## 2022-08-19 DIAGNOSIS — T148XXA Other injury of unspecified body region, initial encounter: Secondary | ICD-10-CM | POA: Diagnosis not present

## 2022-08-19 DIAGNOSIS — M25562 Pain in left knee: Secondary | ICD-10-CM | POA: Diagnosis not present

## 2022-08-19 DIAGNOSIS — W19XXXA Unspecified fall, initial encounter: Secondary | ICD-10-CM | POA: Diagnosis not present

## 2022-08-19 DIAGNOSIS — M950 Acquired deformity of nose: Secondary | ICD-10-CM | POA: Diagnosis not present

## 2022-08-27 DIAGNOSIS — J3489 Other specified disorders of nose and nasal sinuses: Secondary | ICD-10-CM | POA: Diagnosis not present

## 2022-08-27 DIAGNOSIS — S0990XA Unspecified injury of head, initial encounter: Secondary | ICD-10-CM | POA: Diagnosis not present

## 2022-09-04 DIAGNOSIS — H353132 Nonexudative age-related macular degeneration, bilateral, intermediate dry stage: Secondary | ICD-10-CM | POA: Diagnosis not present

## 2022-09-04 DIAGNOSIS — H524 Presbyopia: Secondary | ICD-10-CM | POA: Diagnosis not present

## 2022-09-04 DIAGNOSIS — H26492 Other secondary cataract, left eye: Secondary | ICD-10-CM | POA: Diagnosis not present

## 2022-09-04 DIAGNOSIS — E119 Type 2 diabetes mellitus without complications: Secondary | ICD-10-CM | POA: Diagnosis not present

## 2022-09-04 DIAGNOSIS — H40023 Open angle with borderline findings, high risk, bilateral: Secondary | ICD-10-CM | POA: Diagnosis not present

## 2022-09-04 DIAGNOSIS — H52203 Unspecified astigmatism, bilateral: Secondary | ICD-10-CM | POA: Diagnosis not present

## 2022-09-18 DIAGNOSIS — E1129 Type 2 diabetes mellitus with other diabetic kidney complication: Secondary | ICD-10-CM | POA: Diagnosis not present

## 2022-09-18 DIAGNOSIS — Z794 Long term (current) use of insulin: Secondary | ICD-10-CM | POA: Diagnosis not present

## 2022-09-18 DIAGNOSIS — M8580 Other specified disorders of bone density and structure, unspecified site: Secondary | ICD-10-CM | POA: Diagnosis not present

## 2022-09-18 DIAGNOSIS — I129 Hypertensive chronic kidney disease with stage 1 through stage 4 chronic kidney disease, or unspecified chronic kidney disease: Secondary | ICD-10-CM | POA: Diagnosis not present

## 2022-09-18 DIAGNOSIS — E663 Overweight: Secondary | ICD-10-CM | POA: Diagnosis not present

## 2022-09-18 DIAGNOSIS — E559 Vitamin D deficiency, unspecified: Secondary | ICD-10-CM | POA: Diagnosis not present

## 2022-09-18 DIAGNOSIS — E1136 Type 2 diabetes mellitus with diabetic cataract: Secondary | ICD-10-CM | POA: Diagnosis not present

## 2022-09-18 DIAGNOSIS — N1831 Chronic kidney disease, stage 3a: Secondary | ICD-10-CM | POA: Diagnosis not present

## 2022-09-18 DIAGNOSIS — R053 Chronic cough: Secondary | ICD-10-CM | POA: Diagnosis not present

## 2022-09-18 DIAGNOSIS — D519 Vitamin B12 deficiency anemia, unspecified: Secondary | ICD-10-CM | POA: Diagnosis not present

## 2022-09-18 DIAGNOSIS — E7800 Pure hypercholesterolemia, unspecified: Secondary | ICD-10-CM | POA: Diagnosis not present

## 2022-09-18 DIAGNOSIS — Z23 Encounter for immunization: Secondary | ICD-10-CM | POA: Diagnosis not present

## 2022-12-24 DIAGNOSIS — N1831 Chronic kidney disease, stage 3a: Secondary | ICD-10-CM | POA: Diagnosis not present

## 2022-12-24 DIAGNOSIS — E7800 Pure hypercholesterolemia, unspecified: Secondary | ICD-10-CM | POA: Diagnosis not present

## 2022-12-24 DIAGNOSIS — Z794 Long term (current) use of insulin: Secondary | ICD-10-CM | POA: Diagnosis not present

## 2022-12-24 DIAGNOSIS — E1129 Type 2 diabetes mellitus with other diabetic kidney complication: Secondary | ICD-10-CM | POA: Diagnosis not present

## 2022-12-24 DIAGNOSIS — I129 Hypertensive chronic kidney disease with stage 1 through stage 4 chronic kidney disease, or unspecified chronic kidney disease: Secondary | ICD-10-CM | POA: Diagnosis not present

## 2023-02-25 DIAGNOSIS — E1129 Type 2 diabetes mellitus with other diabetic kidney complication: Secondary | ICD-10-CM | POA: Diagnosis not present

## 2023-02-25 DIAGNOSIS — E559 Vitamin D deficiency, unspecified: Secondary | ICD-10-CM | POA: Diagnosis not present

## 2023-02-25 DIAGNOSIS — E7800 Pure hypercholesterolemia, unspecified: Secondary | ICD-10-CM | POA: Diagnosis not present

## 2023-02-25 DIAGNOSIS — E1136 Type 2 diabetes mellitus with diabetic cataract: Secondary | ICD-10-CM | POA: Diagnosis not present

## 2023-02-25 DIAGNOSIS — D519 Vitamin B12 deficiency anemia, unspecified: Secondary | ICD-10-CM | POA: Diagnosis not present

## 2023-02-25 DIAGNOSIS — E785 Hyperlipidemia, unspecified: Secondary | ICD-10-CM | POA: Diagnosis not present

## 2023-02-25 DIAGNOSIS — N1831 Chronic kidney disease, stage 3a: Secondary | ICD-10-CM | POA: Diagnosis not present

## 2023-02-25 DIAGNOSIS — I129 Hypertensive chronic kidney disease with stage 1 through stage 4 chronic kidney disease, or unspecified chronic kidney disease: Secondary | ICD-10-CM | POA: Diagnosis not present

## 2023-03-04 DIAGNOSIS — E1129 Type 2 diabetes mellitus with other diabetic kidney complication: Secondary | ICD-10-CM | POA: Diagnosis not present

## 2023-03-04 DIAGNOSIS — Z1339 Encounter for screening examination for other mental health and behavioral disorders: Secondary | ICD-10-CM | POA: Diagnosis not present

## 2023-03-04 DIAGNOSIS — R413 Other amnesia: Secondary | ICD-10-CM | POA: Diagnosis not present

## 2023-03-04 DIAGNOSIS — H259 Unspecified age-related cataract: Secondary | ICD-10-CM | POA: Diagnosis not present

## 2023-03-04 DIAGNOSIS — E1136 Type 2 diabetes mellitus with diabetic cataract: Secondary | ICD-10-CM | POA: Diagnosis not present

## 2023-03-04 DIAGNOSIS — N1831 Chronic kidney disease, stage 3a: Secondary | ICD-10-CM | POA: Diagnosis not present

## 2023-03-04 DIAGNOSIS — Z1331 Encounter for screening for depression: Secondary | ICD-10-CM | POA: Diagnosis not present

## 2023-03-04 DIAGNOSIS — M353 Polymyalgia rheumatica: Secondary | ICD-10-CM | POA: Diagnosis not present

## 2023-03-04 DIAGNOSIS — R053 Chronic cough: Secondary | ICD-10-CM | POA: Diagnosis not present

## 2023-03-04 DIAGNOSIS — Z0000 Encounter for general adult medical examination without abnormal findings: Secondary | ICD-10-CM | POA: Diagnosis not present

## 2023-03-04 DIAGNOSIS — M179 Osteoarthritis of knee, unspecified: Secondary | ICD-10-CM | POA: Diagnosis not present

## 2023-03-04 DIAGNOSIS — H9190 Unspecified hearing loss, unspecified ear: Secondary | ICD-10-CM | POA: Diagnosis not present

## 2023-03-04 DIAGNOSIS — R82998 Other abnormal findings in urine: Secondary | ICD-10-CM | POA: Diagnosis not present

## 2023-03-04 DIAGNOSIS — E7800 Pure hypercholesterolemia, unspecified: Secondary | ICD-10-CM | POA: Diagnosis not present

## 2023-03-04 DIAGNOSIS — Z794 Long term (current) use of insulin: Secondary | ICD-10-CM | POA: Diagnosis not present

## 2023-03-04 DIAGNOSIS — I129 Hypertensive chronic kidney disease with stage 1 through stage 4 chronic kidney disease, or unspecified chronic kidney disease: Secondary | ICD-10-CM | POA: Diagnosis not present

## 2023-03-04 DIAGNOSIS — D692 Other nonthrombocytopenic purpura: Secondary | ICD-10-CM | POA: Diagnosis not present

## 2023-05-21 DIAGNOSIS — I129 Hypertensive chronic kidney disease with stage 1 through stage 4 chronic kidney disease, or unspecified chronic kidney disease: Secondary | ICD-10-CM | POA: Diagnosis not present

## 2023-05-21 DIAGNOSIS — E1129 Type 2 diabetes mellitus with other diabetic kidney complication: Secondary | ICD-10-CM | POA: Diagnosis not present

## 2023-05-21 DIAGNOSIS — E7800 Pure hypercholesterolemia, unspecified: Secondary | ICD-10-CM | POA: Diagnosis not present

## 2023-05-21 DIAGNOSIS — Z794 Long term (current) use of insulin: Secondary | ICD-10-CM | POA: Diagnosis not present

## 2023-05-21 DIAGNOSIS — N1831 Chronic kidney disease, stage 3a: Secondary | ICD-10-CM | POA: Diagnosis not present

## 2023-05-23 DIAGNOSIS — H04123 Dry eye syndrome of bilateral lacrimal glands: Secondary | ICD-10-CM | POA: Diagnosis not present

## 2023-05-23 DIAGNOSIS — H40023 Open angle with borderline findings, high risk, bilateral: Secondary | ICD-10-CM | POA: Diagnosis not present

## 2023-05-23 DIAGNOSIS — H02105 Unspecified ectropion of left lower eyelid: Secondary | ICD-10-CM | POA: Diagnosis not present

## 2023-08-25 DIAGNOSIS — M8580 Other specified disorders of bone density and structure, unspecified site: Secondary | ICD-10-CM | POA: Diagnosis not present

## 2023-08-25 DIAGNOSIS — D519 Vitamin B12 deficiency anemia, unspecified: Secondary | ICD-10-CM | POA: Diagnosis not present

## 2023-08-25 DIAGNOSIS — E663 Overweight: Secondary | ICD-10-CM | POA: Diagnosis not present

## 2023-08-25 DIAGNOSIS — R413 Other amnesia: Secondary | ICD-10-CM | POA: Diagnosis not present

## 2023-08-25 DIAGNOSIS — I129 Hypertensive chronic kidney disease with stage 1 through stage 4 chronic kidney disease, or unspecified chronic kidney disease: Secondary | ICD-10-CM | POA: Diagnosis not present

## 2023-08-25 DIAGNOSIS — M353 Polymyalgia rheumatica: Secondary | ICD-10-CM | POA: Diagnosis not present

## 2023-08-25 DIAGNOSIS — R053 Chronic cough: Secondary | ICD-10-CM | POA: Diagnosis not present

## 2023-08-25 DIAGNOSIS — E1129 Type 2 diabetes mellitus with other diabetic kidney complication: Secondary | ICD-10-CM | POA: Diagnosis not present

## 2023-08-25 DIAGNOSIS — E559 Vitamin D deficiency, unspecified: Secondary | ICD-10-CM | POA: Diagnosis not present

## 2023-08-25 DIAGNOSIS — N1831 Chronic kidney disease, stage 3a: Secondary | ICD-10-CM | POA: Diagnosis not present

## 2023-08-25 DIAGNOSIS — E7800 Pure hypercholesterolemia, unspecified: Secondary | ICD-10-CM | POA: Diagnosis not present

## 2023-08-25 DIAGNOSIS — Z794 Long term (current) use of insulin: Secondary | ICD-10-CM | POA: Diagnosis not present

## 2023-11-12 DIAGNOSIS — H524 Presbyopia: Secondary | ICD-10-CM | POA: Diagnosis not present

## 2023-11-12 DIAGNOSIS — H02105 Unspecified ectropion of left lower eyelid: Secondary | ICD-10-CM | POA: Diagnosis not present

## 2023-11-12 DIAGNOSIS — E113291 Type 2 diabetes mellitus with mild nonproliferative diabetic retinopathy without macular edema, right eye: Secondary | ICD-10-CM | POA: Diagnosis not present

## 2023-11-12 DIAGNOSIS — H353132 Nonexudative age-related macular degeneration, bilateral, intermediate dry stage: Secondary | ICD-10-CM | POA: Diagnosis not present

## 2023-11-12 DIAGNOSIS — H40023 Open angle with borderline findings, high risk, bilateral: Secondary | ICD-10-CM | POA: Diagnosis not present

## 2023-11-12 DIAGNOSIS — H04123 Dry eye syndrome of bilateral lacrimal glands: Secondary | ICD-10-CM | POA: Diagnosis not present

## 2023-11-12 DIAGNOSIS — H26492 Other secondary cataract, left eye: Secondary | ICD-10-CM | POA: Diagnosis not present

## 2023-11-12 DIAGNOSIS — H52203 Unspecified astigmatism, bilateral: Secondary | ICD-10-CM | POA: Diagnosis not present
